# Patient Record
Sex: Female | Born: 1975 | Hispanic: No | Marital: Married
Health system: Western US, Academic
[De-identification: ages and names within clinical notes are randomized; demographics above are authoritative.]

---

## 2017-12-29 ENCOUNTER — Ambulatory Visit: Admit: 2017-12-29 | Discharge: 2017-12-29 | Disposition: A | Discharge status: Home / Self Care | Payer: Self-pay

## 2018-09-06 ENCOUNTER — Ambulatory Visit: Admit: 2018-09-06 | Discharge: 2018-09-06 | Disposition: A | Discharge status: Home / Self Care | Payer: Self-pay

## 2018-11-07 ENCOUNTER — Encounter: Payer: Self-pay | Admitting: Nurse Practitioner

## 2018-11-07 NOTE — Progress Notes (Signed)
Outside images archived.        Catherine Kearns, NP

## 2018-11-08 ENCOUNTER — Other Ambulatory Visit: Payer: Self-pay | Admitting: Nurse Practitioner

## 2020-07-17 IMAGING — MG MAMMOGRAPHY DIAGNOSTIC BILATERAL 3D TOMOSYNTHESIS WITH CAD
7 of 14 series · 7 of 40 positions shown · non-contrast
Comparison: Comparison was made to prior examinations. 
BREAST DENSITY: (Level B) There are scattered areas of fibroglandular density.

MAMMOGRAPHY DIAGNOSTIC BILATERAL 3D TOMOSYNTHESIS WITH CAD, BREAST ULTRASOUND 
BILATERAL COMPLETE, 07/17/2020 [DATE]: 
CLINICAL INDICATION: 2 areas of palpable concern involving the right breast. 
Strong family history of breast cancer. Bilateral breast biopsies. History of 
right breast excisional biopsy.
TECHNIQUE: Digital bilateral mammograms and 3-D Tomosynthesis were obtained. 
These were interpreted both primarily and with the aid of computer-aided 
detection system. Bilateral whole breast ultrasound was also performed.

[R CC (1 of 3)]
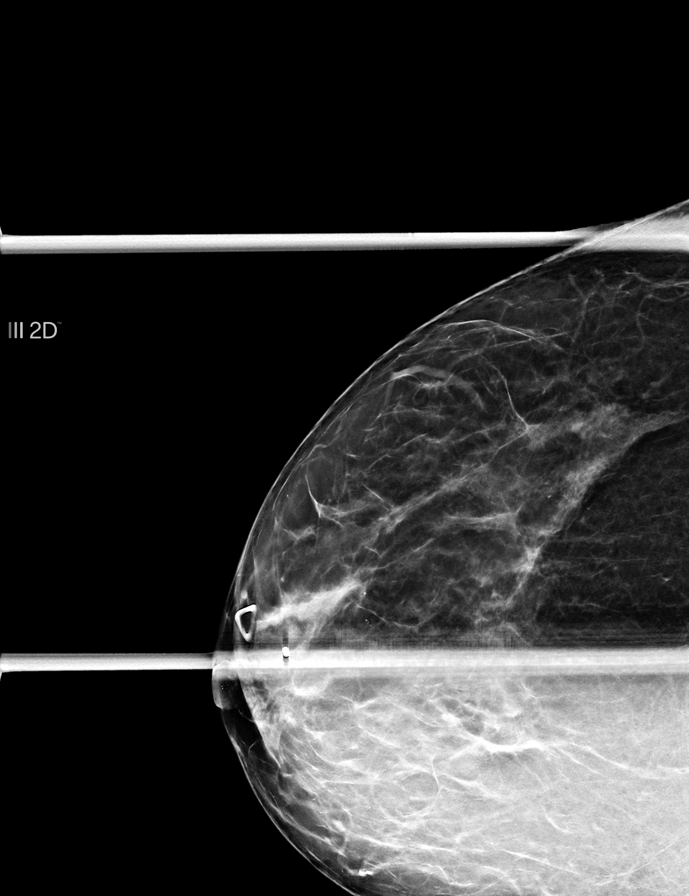

[R CC (2 of 3)]
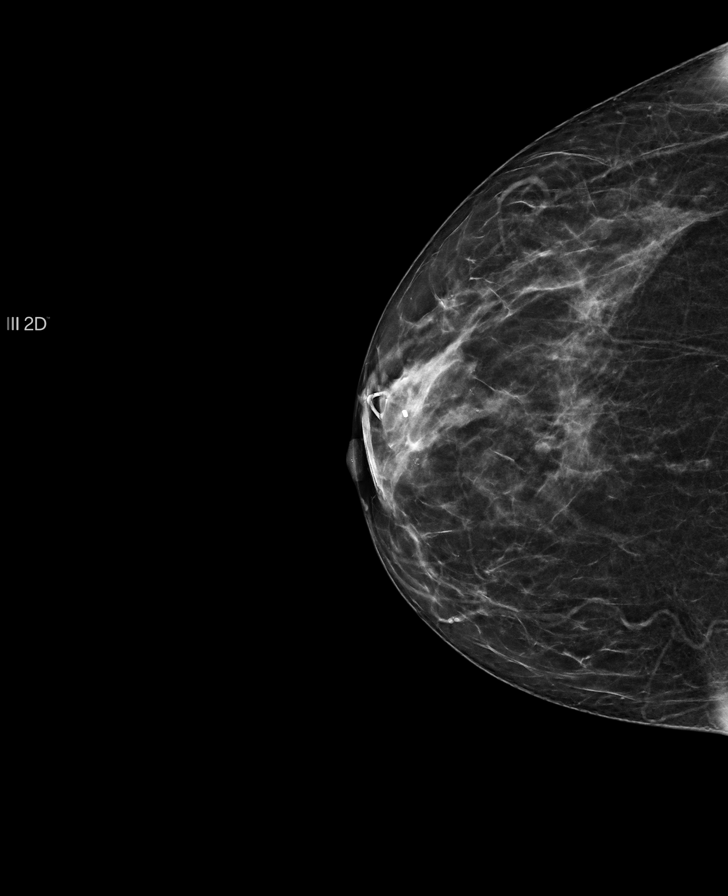

[R MLO]
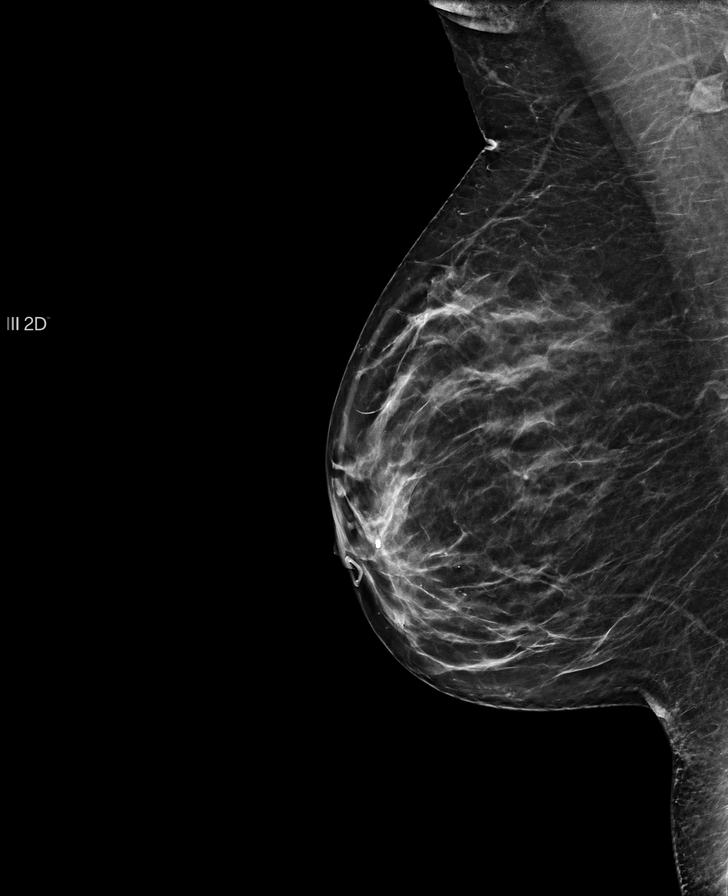

[L MLO]
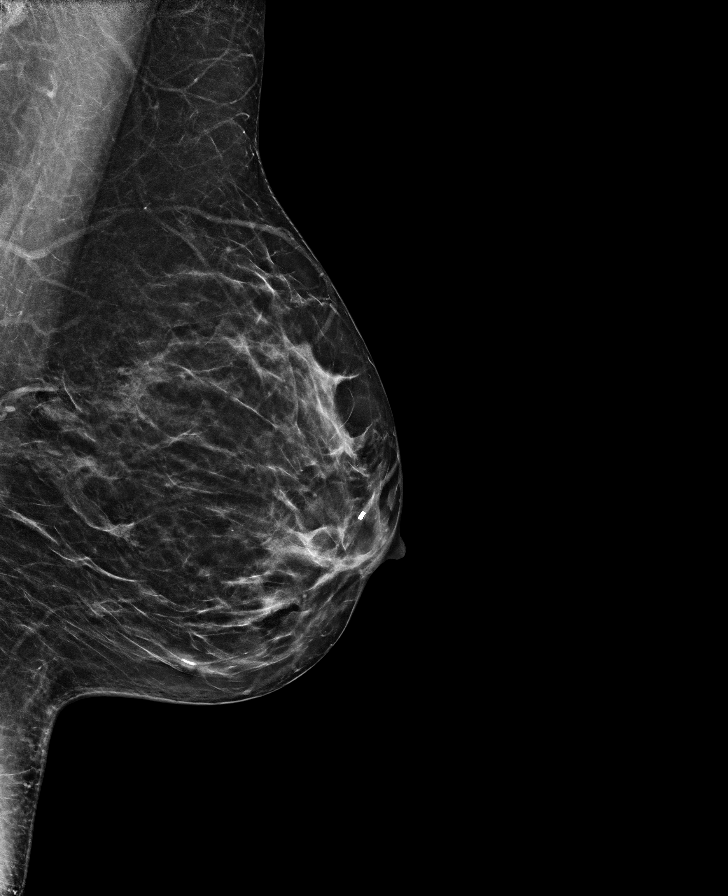

[R CC (3 of 3)]
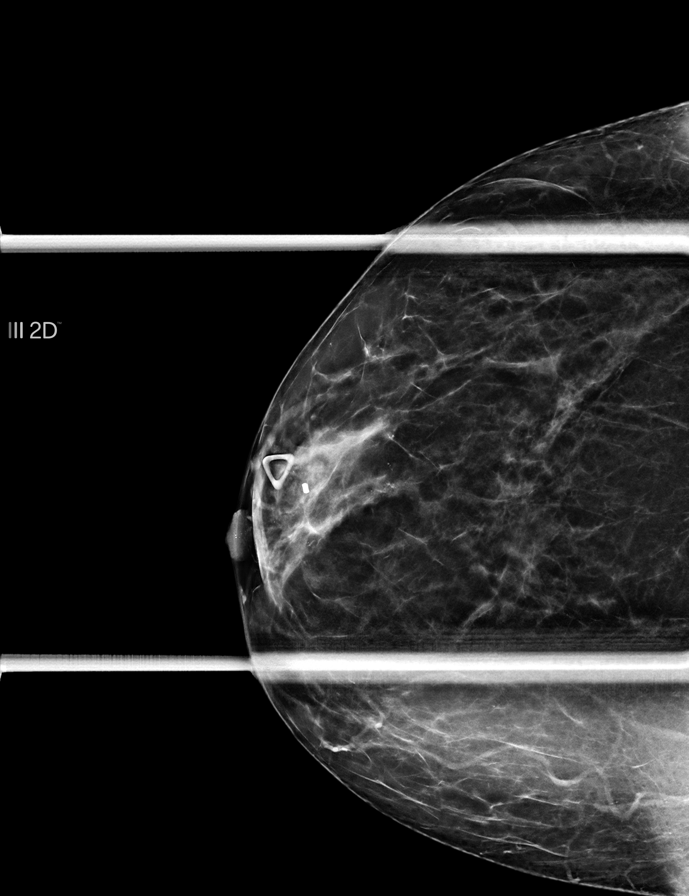

[R ML]
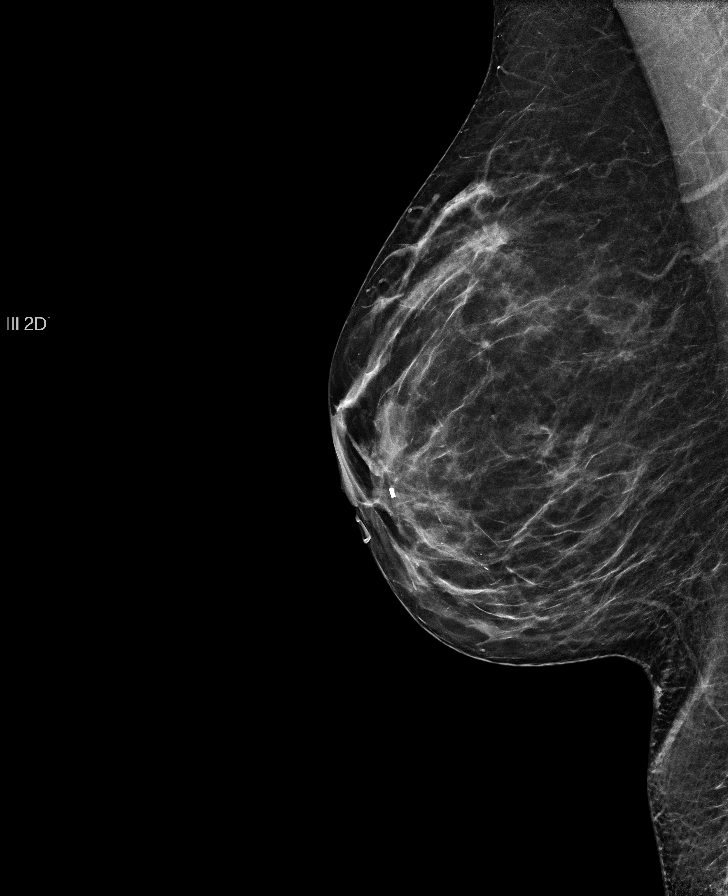

[L CC]
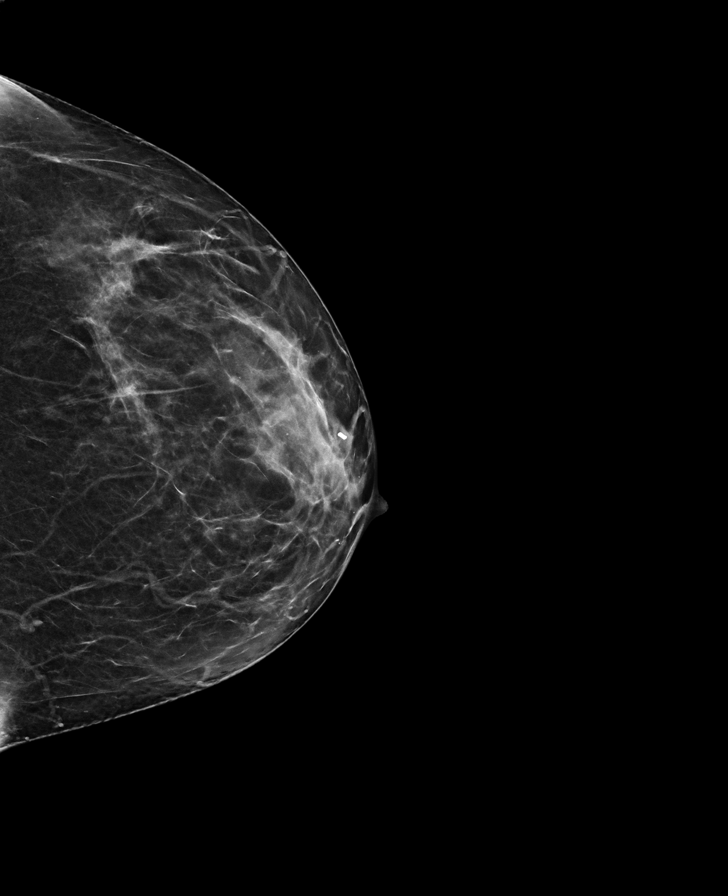

[7 of 40 positions shown; findings below may reference images not displayed]

FINDINGS: There is a right breast retroareolar post procedure clip. There is a 
palpable marker in the inferior retroareolar position of the right breast. There 
is no suspicious abnormality at this site. 
In the lateral right breast appreciated on the CC projection only located 9 cm 
from the nipple there is a circumscribed lesion. This area effaces on spot 
compression views there is a small circumscribed lesion projecting to the 
inferior lateral right breast measuring 4 mm. No definite correlate is 
identified on right MLO or mediolateral projection. 
In the upper outer quadrant of the left breast located 9 cm from the nipple 
there is a circumscribed lesion measuring 5 mm. This is favored to represent an 
intramammary lymph node but is ultimately indeterminate. No prior examinations 
are available for comparison. There is also a post procedure clip in the 
retroareolar position of the left breast. 
Bilateral whole breast ultrasound reveals a circumscribed hypoechoic lesion in 
the right breast at 8 o'clock 2 cm from the nipple measuring 4 x 3 x 3 mm 
corresponding to the site of palpable concern. This didn't straighten ultrasound 
appearance most consistent with an intramammary lymph node. Nonetheless 
follow-up is necessary. 
In the right axillary region at the second site of palpable concern there is a 
sonographically benign-appearing lymph node measuring up to 17 mm. No suspicious 
features. No other potential lesion is identified. Throughout the remainder the 
right breast to include all 4 quadrants the retroareolar position in the 
axillary region there is no suspicious mass, cyst, or adenopathy. 
In the left breast to include all 4 quadrants the retroareolar position and the 
axillary region there is no suspicious mass or cystic lesion. No worrisome 
adenopathy.
IMPRESSION: 1.  No correlate for the circumscribed mammographic lesions in the lateral 
breast bilaterally. All these are favored to represent intramammary lymph nodes 
no prior mammography is available to establish stability. Breast MRI is 
recommended for further evaluation. 
2.  Hypoechoic lesion in the right breast at 8 o'clock 2 cm from the nipple 
corresponding to the periareolar area of palpable concern. Favored to represent 
an intramammary lymph node. This is too small to identify with accuracy on MRI. 
Follow-up in 6 months time via ultrasound is recommended. 
3.  Sonographically benign-appearing lymph node in the right axillary region at 
the second site of palpable concern in the right breast. No suspicious findings. 
Clinical management of the areas of palpable concern is ultimately necessary. 
4.  No sonographic evidence of malignancy throughout the left breast. 
(BI-RADS 0) Further evaluation with MRI is suggested.

## 2021-08-04 IMAGING — MR MRI CERVICAL SPINE W/WO CONTRAST
9 of 10 series · 37 of 48 positions shown · IV contrast (gadavist)
Comparison: Brain MRI August 04, 2021.

________________________________________________________________________________________________ 
MRI CERVICAL SPINE W/WO CONTRAST, 08/04/2021 [DATE]: 
CLINICAL INDICATION: 45-year-old female with seizure disorder and demyelinating 
disorder. Neuromyelitis [place]. Diminished sensation in the arm and leg. History 
of breast cancer.
TECHNIQUE: Multiplanar, multiecho position MR images of the cervical spine were 
performed without and with 10 mL of Gadavist were injected. Patient was scanned 
on a 3T magnet.

[Series 901: survey · axial · 10.0mm · 0.94mm/px · 1 of 9 slices shown]
[im 1/9]
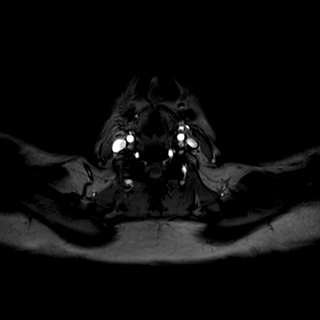

[Series 1001: t2w_tse cor · coronal · 5.0mm · 0.47mm/px · 1 of 7 slices shown]
[im 1/7]
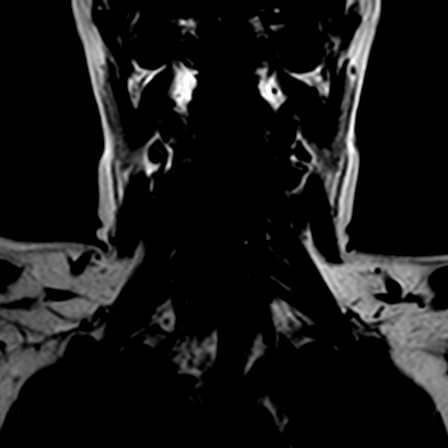

[Series 1101: t1w_tse sag · sagittal · 3.0mm · 0.42mm/px · 4 of 15 slices shown]
[im 1/15]
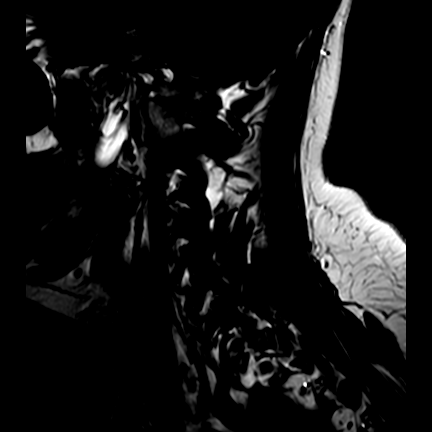
[im 5/15]
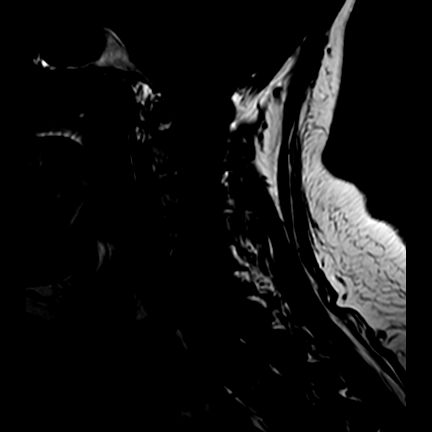
[im 10/15]
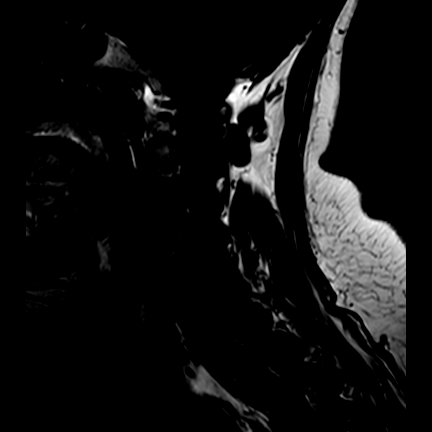
[im 15/15]
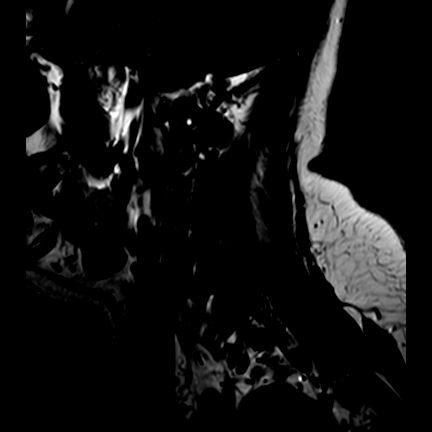

[Series 1201: t2w_tse sag · sagittal · 3.0mm · 0.52mm/px · 4 of 15 slices shown]
[im 1/15]
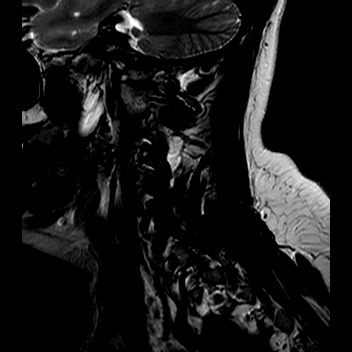
[im 5/15]
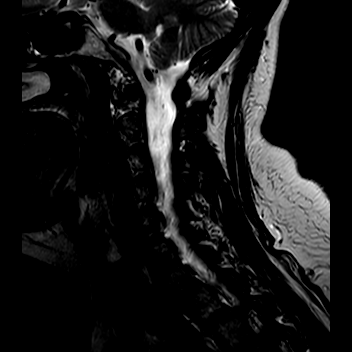
[im 10/15]
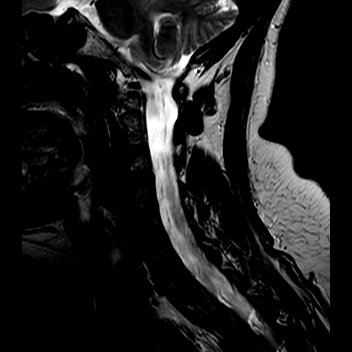
[im 15/15]
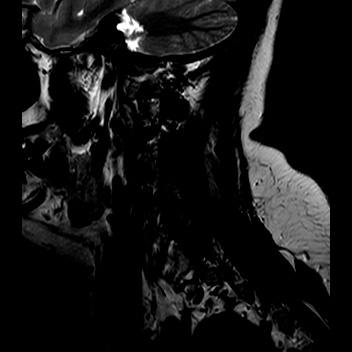

[Series 1301: stir_longte sag · sagittal · 3.0mm · 0.62mm/px · 4 of 15 slices shown]
[im 1/15]
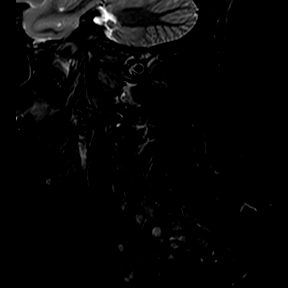
[im 5/15]
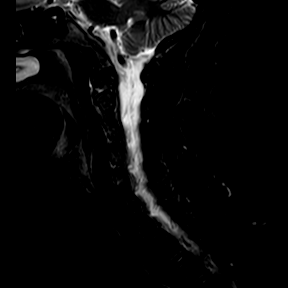
[im 10/15]
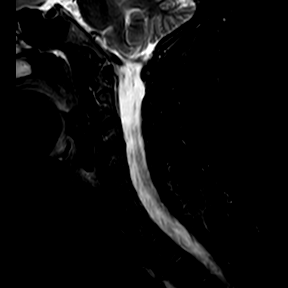
[im 15/15]
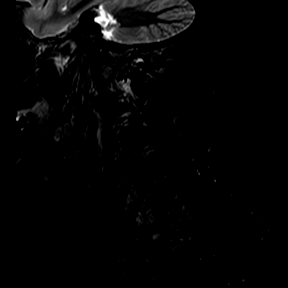

[Series 1401: t2w_tse_ax · axial · 3.0mm · 0.36mm/px · z∈[-185,-62]mm · 8 of 42 slices shown]
[im 1/42]
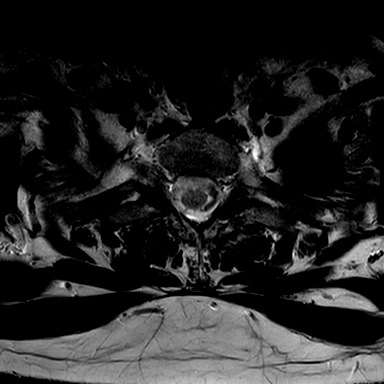
[im 5/42]
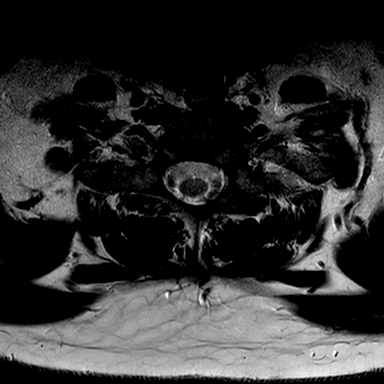
[im 14/42]
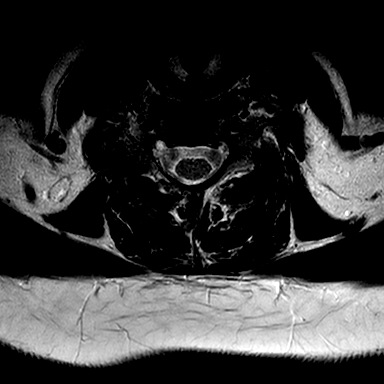
[im 19/42]
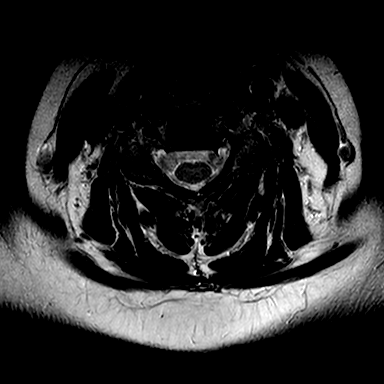
[im 23/42]
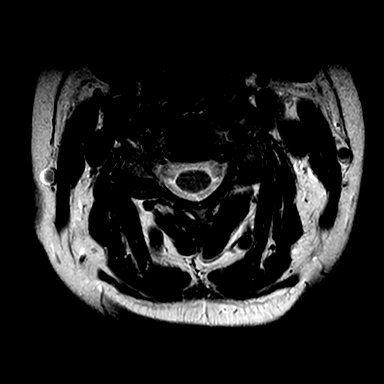
[im 28/42]
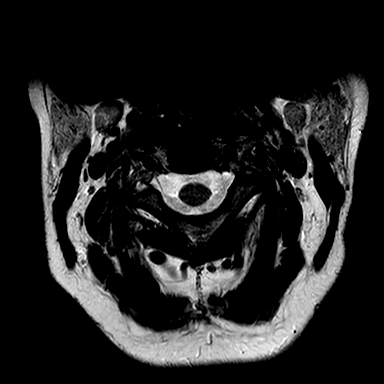
[im 37/42]
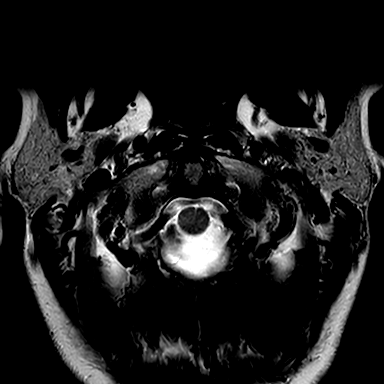
[im 42/42]
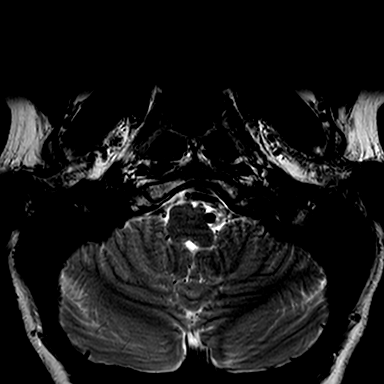

[Series 1501: t2w_ffe_3d hi (dr.(person_name) · axial · 3.0mm · 0.47mm/px · z∈[-152,-105]mm · 8 of 36 slices shown]
[im 1/36]
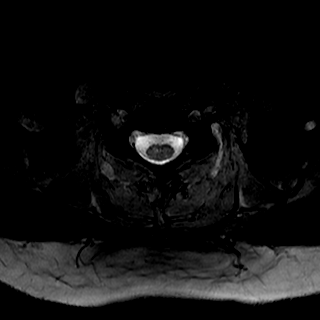
[im 6/36]
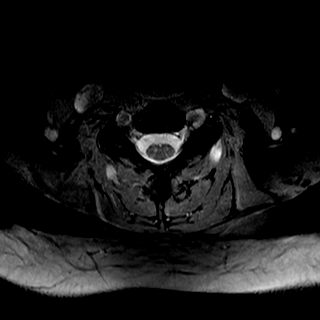
[im 11/36]
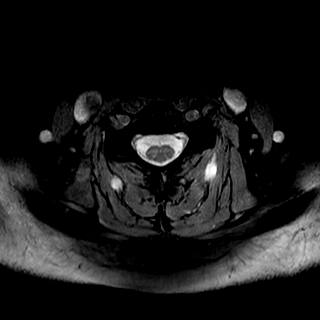
[im 16/36]
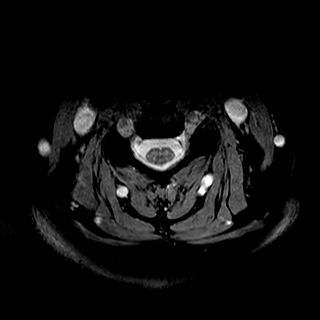
[im 21/36]
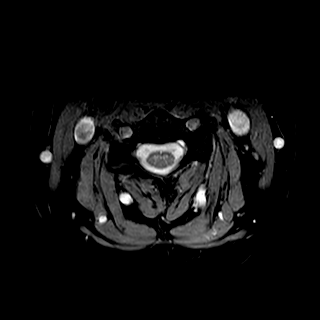
[im 26/36]
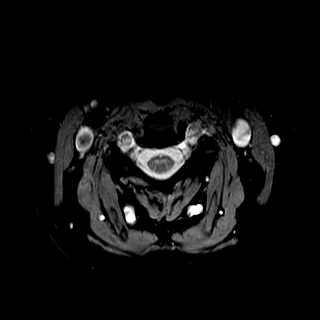
[im 31/36]
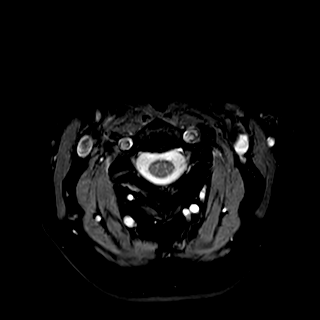
[im 36/36]
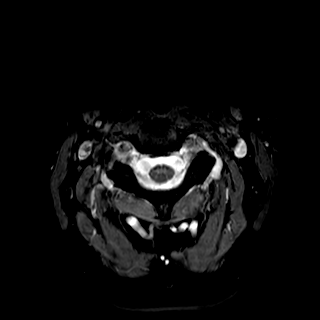

[Series 1701: gad t1w_tse sag · sagittal · 3.0mm · 0.51mm/px · 3 of 15 slices shown]
[im 1/15]
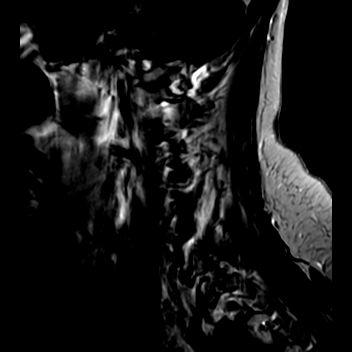
[im 5/15]
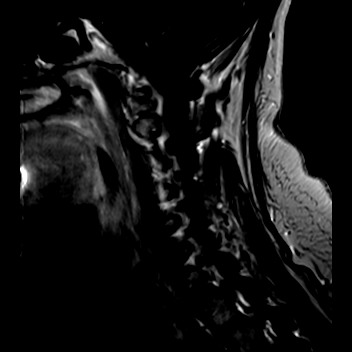
[im 10/15]
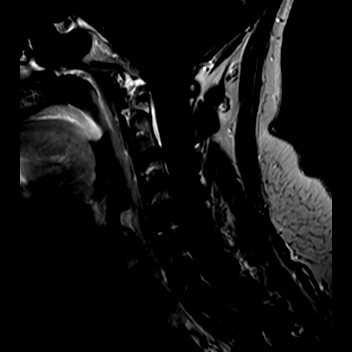

[Series 1801: T1 fat-sat post-contrast · sagittal · 3.0mm · 0.51mm/px · 4 of 15 slices shown]
[im 1/15]
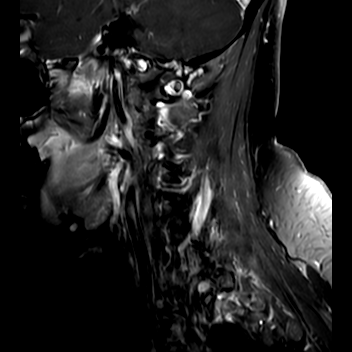
[im 5/15]
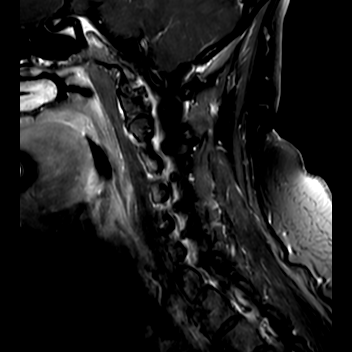
[im 10/15]
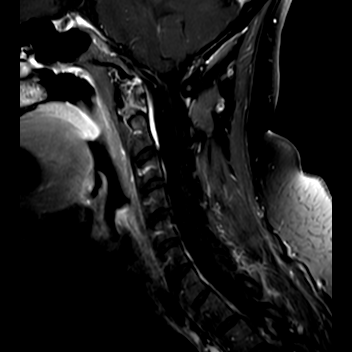
[im 15/15]
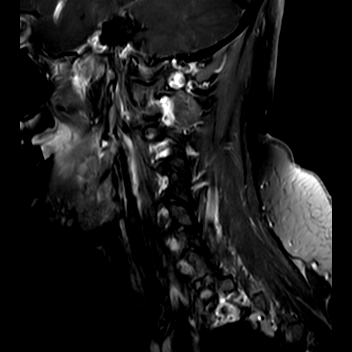

[37 of 48 positions shown; findings below may reference images not displayed]

FINDINGS: Anatomic sagittal alignment. Vertebral body height preserved. Normal 
marrow signal. No marrow or ligamentous edema. Visualized brainstem and 
posterior fossa are unremarkable. No abnormal enhancement. Bilateral nonenlarged 
level 2A lymph nodes noted, measuring 1.3 cm on the right and measuring 1.1 cm 
on the left. No evidence of cord atrophy, expansion, or abnormal cord signal. No 
abnormal cord enhancement..  
Craniocervical junction: Normal 
C2-C3: Slight loss of disc signal. Canal and foramina with normal facets. Normal 
disc height. 
C3-C4: The disc is normal in height and signal. No disc herniation. Normal 
facets. No spinal canal or neural foraminal stenosis. 
C4-C5: The disc is normal in height and signal. No disc herniation. Normal 
facets. No spinal canal or neural foraminal stenosis. 
C5-C6: The disc is normal in height and signal. No disc herniation. Normal 
facets. No spinal canal or neural foraminal stenosis. 
C6-C7: The disc is normal in height and signal. No disc herniation. Normal 
facets. No spinal canal or neural foraminal stenosis. 
C7-T1: The disc is normal in height and signal. No disc herniation. Normal 
facets. No spinal canal or neural foraminal stenosis. 
Visualized upper thoracic segments are unremarkable.
IMPRESSION: Unremarkable cervical spine MRI. No abnormal enhancement. No abnormal cord 
signal. 
Bilateral nonenlarged level 2A lymph nodes noted.

## 2021-08-04 IMAGING — MR MRI BRAIN W/WO CONTRAST
6 of 14 series · 18 of 48 positions shown · IV contrast (gadavist)
Comparison: None

________________________________________________________________________________________________ 
******** ADDENDUM #1 ********/n 
***CORRECTED REPORT, TECHNIQUE EDITED***
TECHNIQUE: Multiplanar, multiecho position MR images of the brain were performed 
without and with 10 mL of Gadavist contrast. Patient was scanned on a 3T magnet. 
MRI BRAIN W/WO CONTRAST, 08/04/2021 [DATE]: 
CLINICAL INDICATION: 45-year-old female with seizure disorder. Demyelinating 
disorder. Neuromyelitis [place]. History of breast cancer.
without and with mL of Gadavist contrast. Patient was scanned on a 3T magnet.

[Series 401: FLAIR · axial · 5.0mm · 0.60mm/px · z∈[-89,+58]mm · 2 of 27 slices shown (1 of 2)]
[im 1/27]
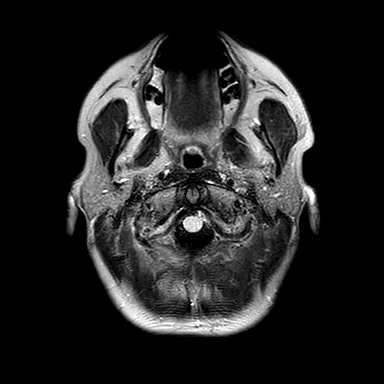
[im 27/27]
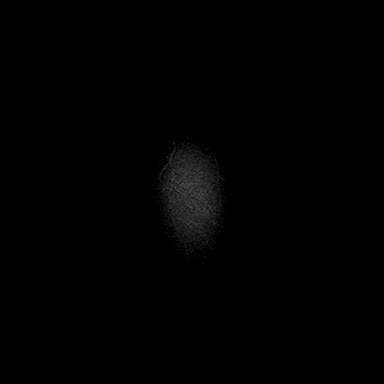

[Series 601: SWI · axial · 3.0mm · 0.53mm/px · z∈[-88,-71]mm · 2 of 100 slices shown]
[im 1/100]
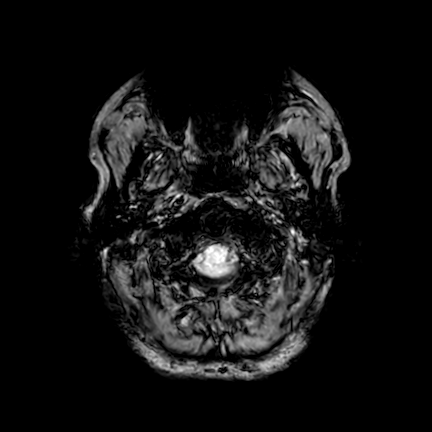
[im 13/100]
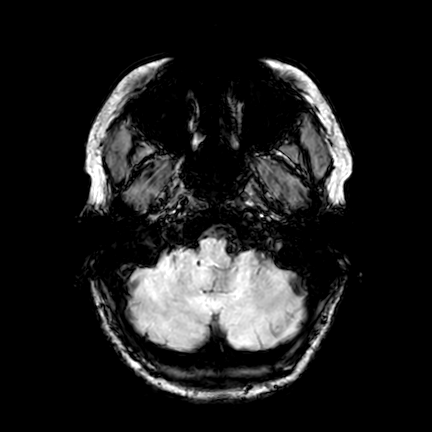

[Series 701: T2 · axial · 5.0mm · 0.41mm/px · z∈[-90,+58]mm · 3 of 27 slices shown]
[im 1/27]
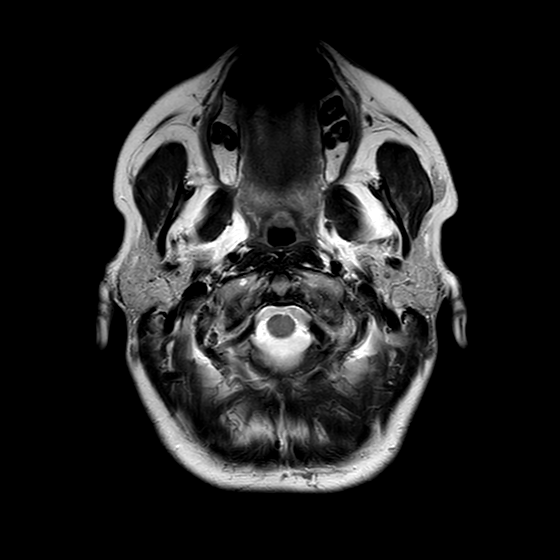
[im 14/27]
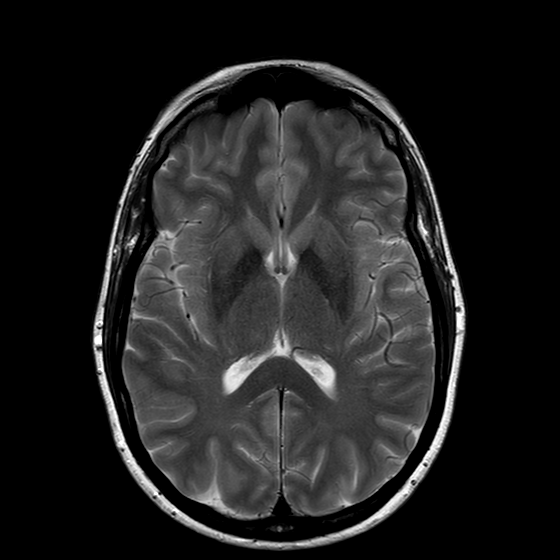
[im 27/27]
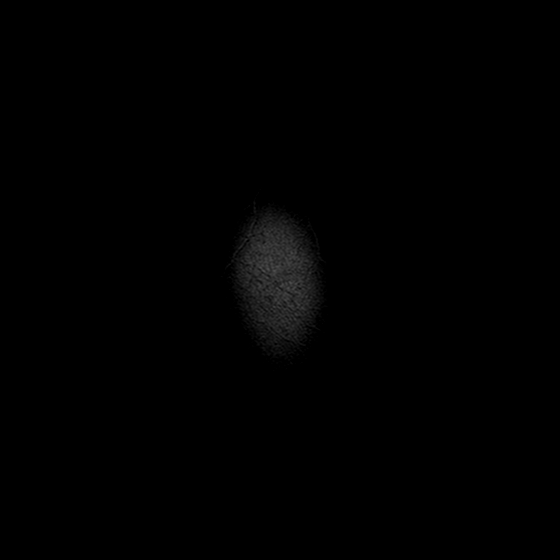

[Series 801: FLAIR · sagittal · 3.0mm · 0.45mm/px · 4 of 40 slices shown (2 of 2)]
[im 1/40]
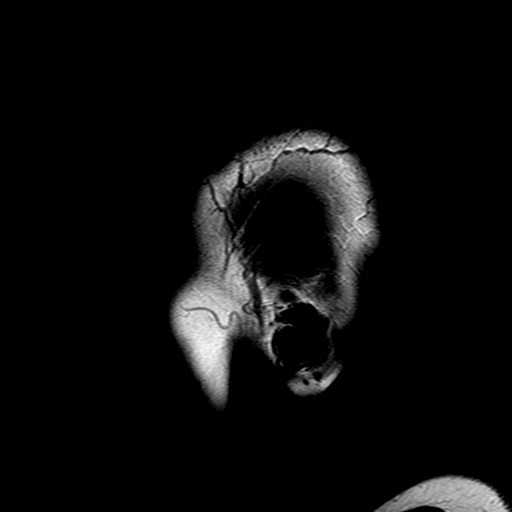
[im 14/40]
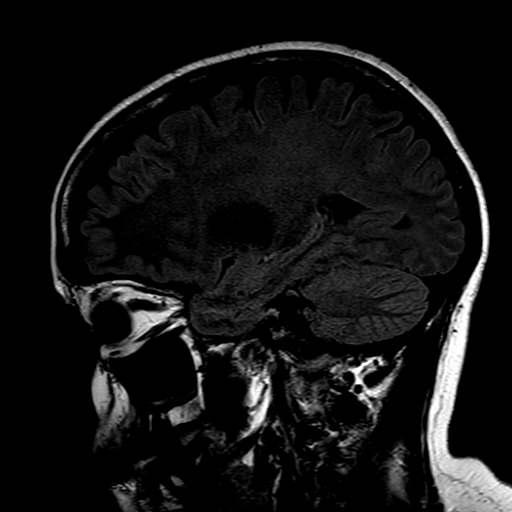
[im 27/40]
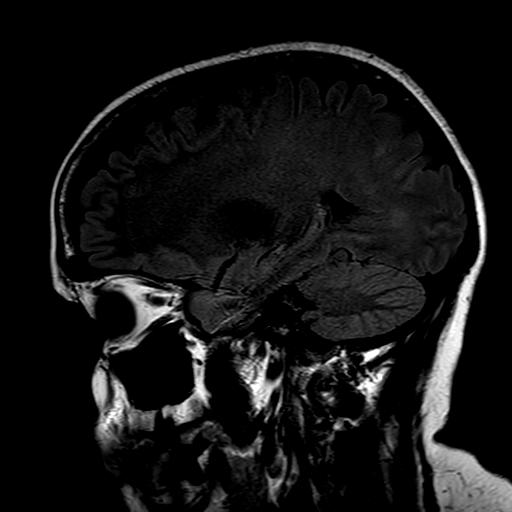
[im 40/40]
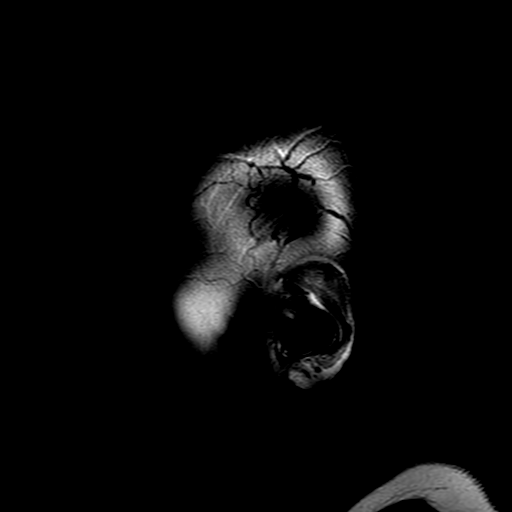

[Series 2101: T1 post-contrast · coronal · 4.0mm · 0.38mm/px · 4 of 38 slices shown (1 of 2)]
[im 1/38]
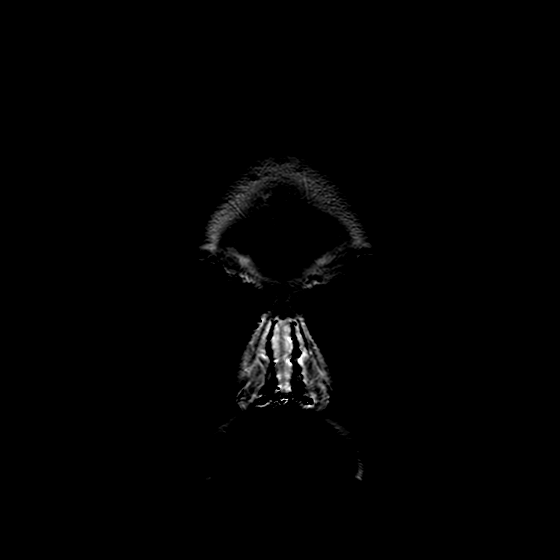
[im 13/38]
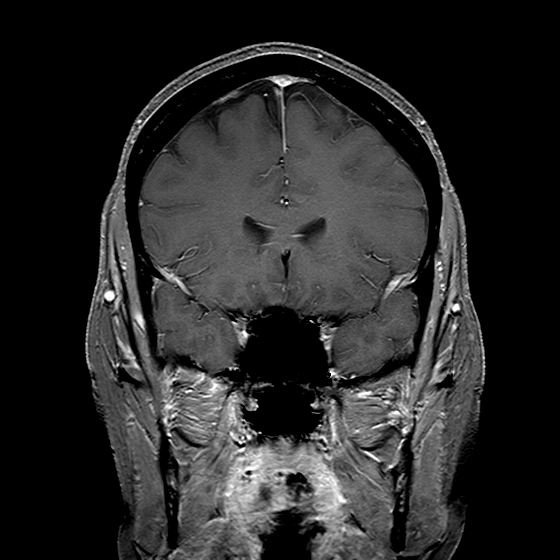
[im 25/38]
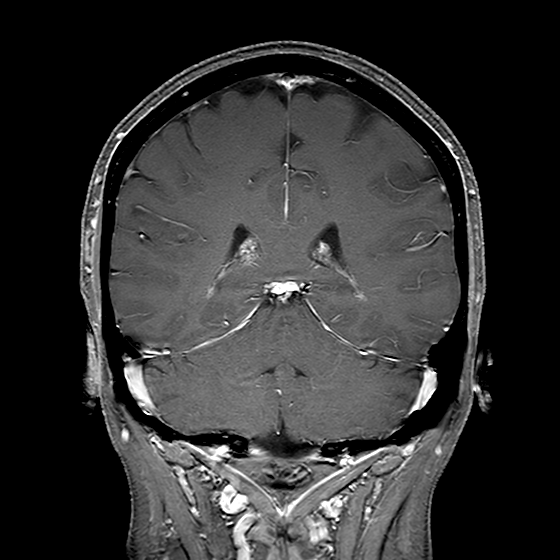
[im 38/38]
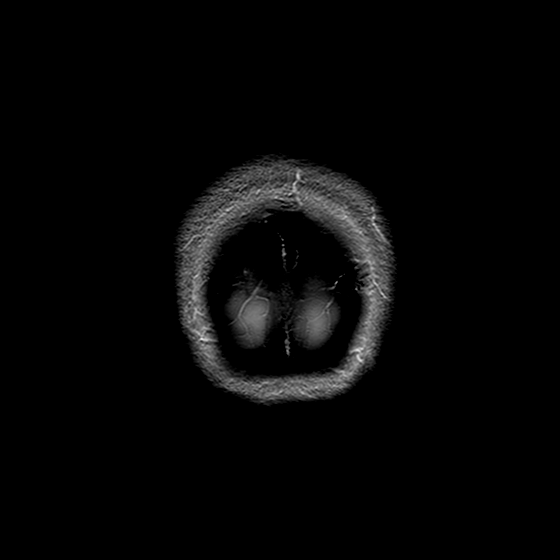

[Series 2201: T1 post-contrast · sagittal · 4.0mm · 0.34mm/px · 3 of 36 slices shown (2 of 2)]
[im 1/36]
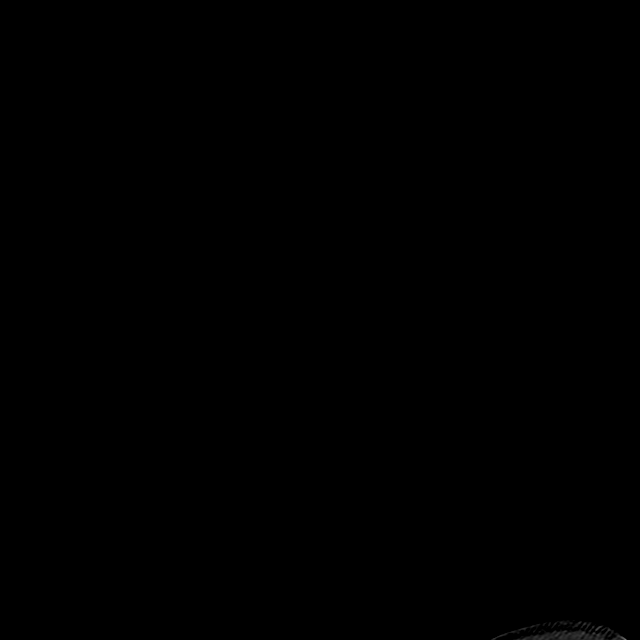
[im 18/36]
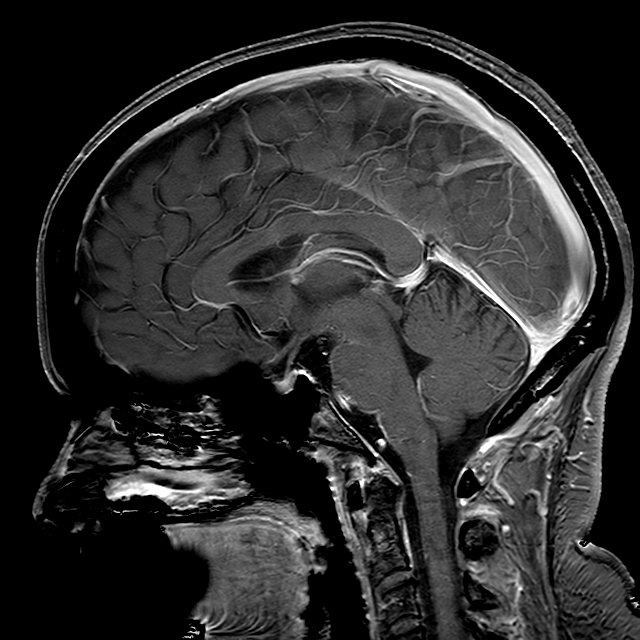
[im 36/36]
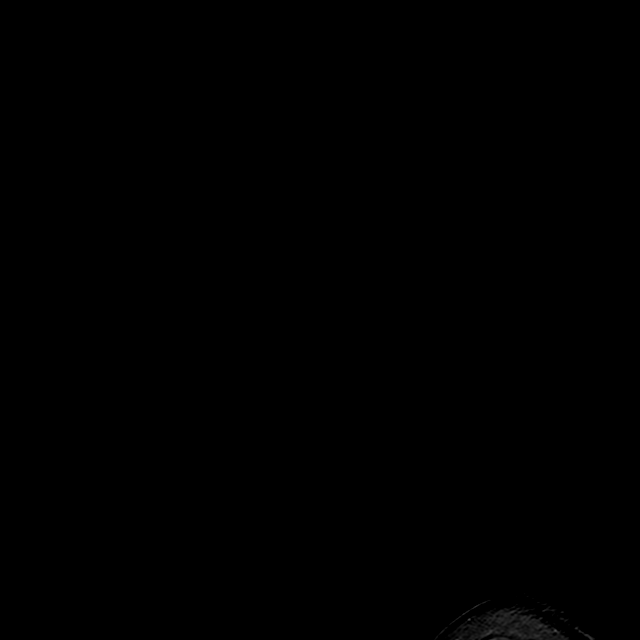

[18 of 48 positions shown; findings below may reference images not displayed]

FINDINGS: Intracranial contents: Punctate focus of increased T2 signal in the left 
paramedian midbrain image 11 series 505 is related to T2 shine through. No 
evidence of acute or subacute infarct. Normally positioned cerebellar tonsils. 
No intracranial hemorrhage of any type. Corpus callosum demonstrates preserved 
parenchymal volume. No periventricular or pericallosal signal abnormalities. 
Sella unremarkable. Brain parenchymal signal pattern is unremarkable. There are 
preserved skull base vascular flow voids. Upper cervical cord unremarkable. No 
mass, mass effect, abnormal extra-axial fluid collection. Ventricles and sulci 
are appropriate for age. No abnormal enhancement. No evidence of temporal lobe 
atrophy. Hippocampal formations are preserved. 
Orbits sinuses and temporal bones: No acute orbital abnormality. Left maxillary 
mucous retention cyst. Other sinuses are well pneumatized. No mastoid or middle 
ear effusion. 
Osseous structures and soft tissues: Normal marrow signal.
IMPRESSION: Unremarkable brain MRI. Brain parenchymal signal pattern is unremarkable. No 
abnormal enhancement. No evidence of intracranial metastatic disease.

## 2021-08-25 IMAGING — MG MAMMOGRAPHY DIAGNOSTIC BILATERAL 3[PERSON_NAME]
8 series · 8 of 24 positions shown · non-contrast
Comparison: July 17, 2020

________________________________________________________________________________________________ 
MAMMOGRAPHY DIAGNOSTIC BILATERAL 3YHOMAR GUTIERREX, 08/25/2021 [DATE]: 
CLINICAL INDICATION: Family history of breast malignancy. Prior bilateral 
negative breast biopsies. More recently patient complains of the inflamed and 
pain in the RIGHT breast.
TECHNIQUE: Digital bilateral mammograms and 3-D Tomosynthesis were obtained. 
These were interpreted both primarily and with the aid of computer-aided 
detection system.  
BREAST DENSITY: (Level B) There are scattered areas of fibroglandular density.

[R MLO]
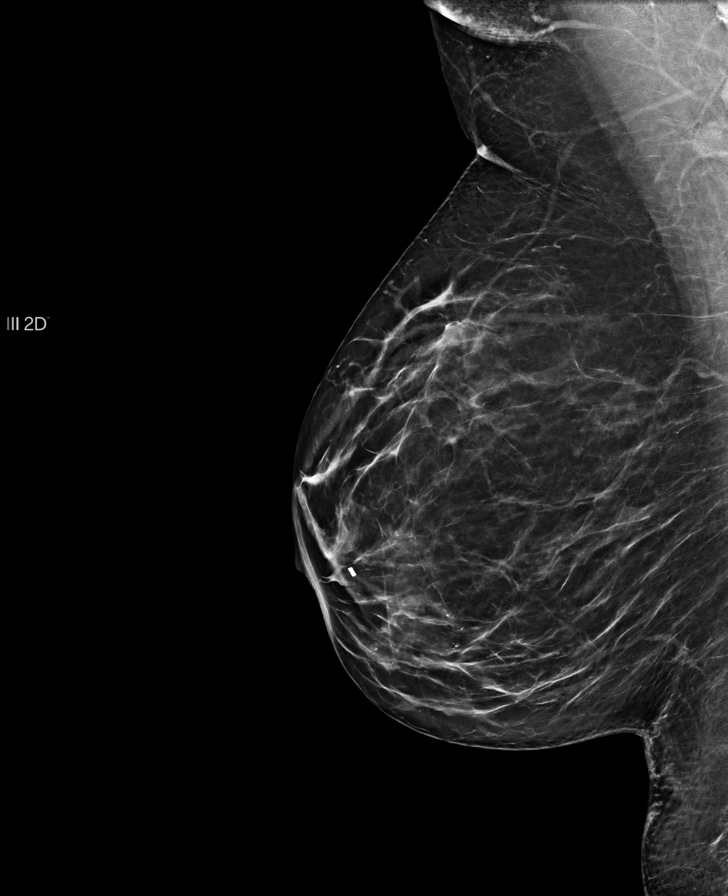

[L MLO]
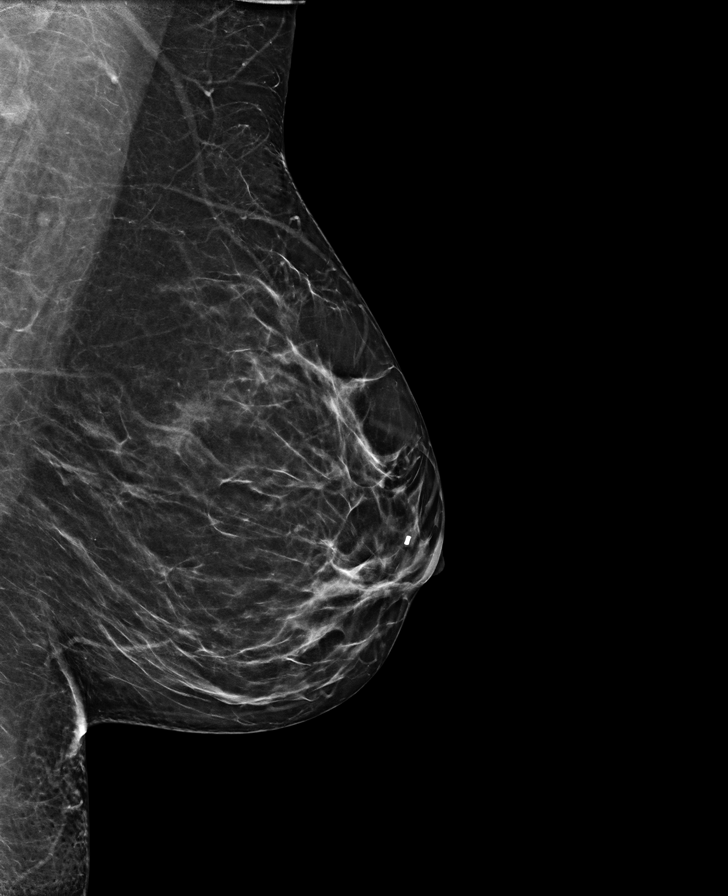

[L CC]
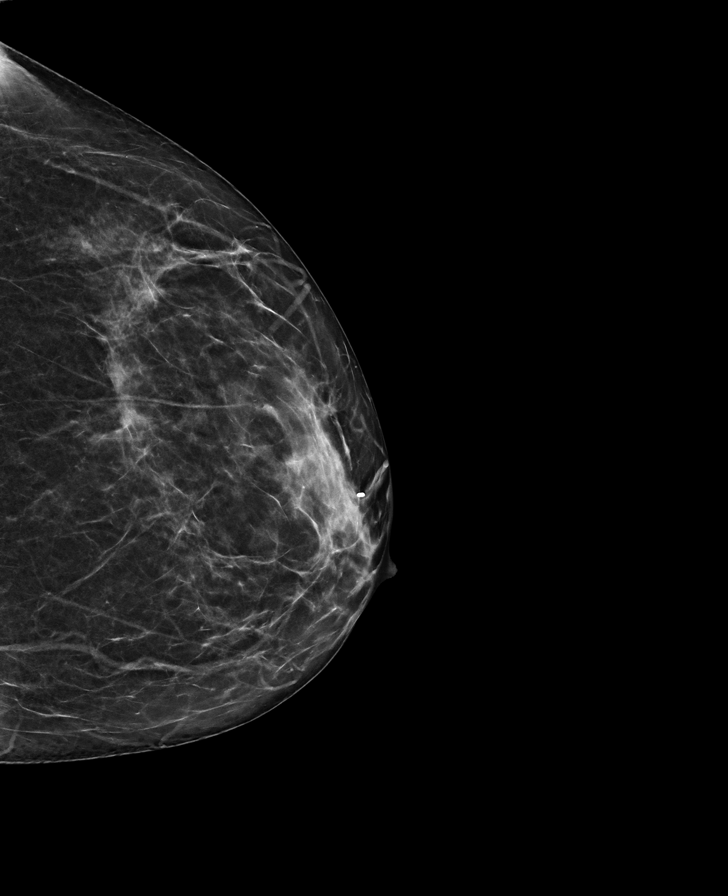

[R CC]
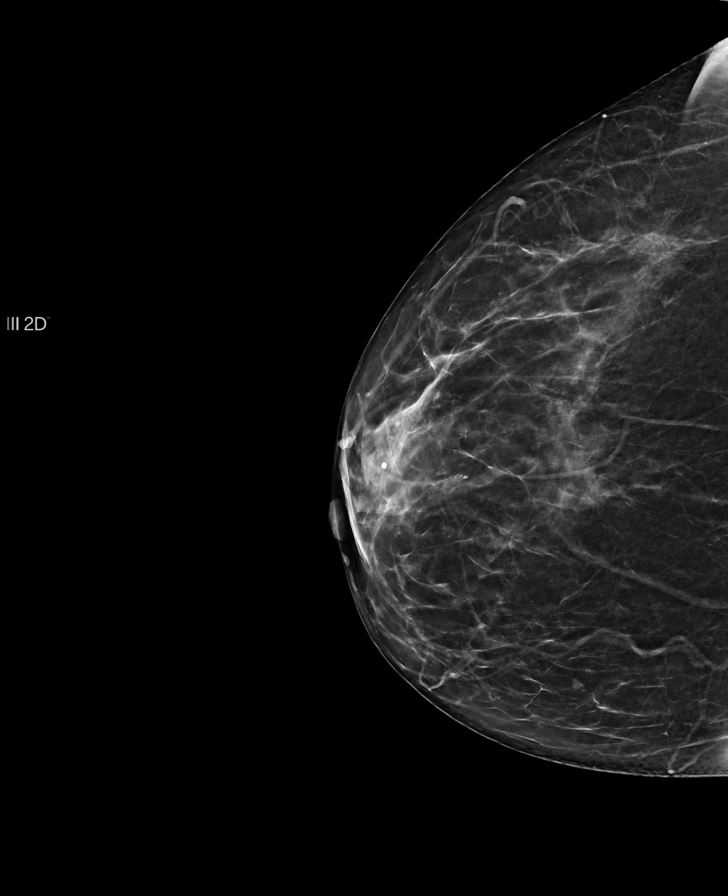

[R CC tomo · tomo slice 29/57.0]
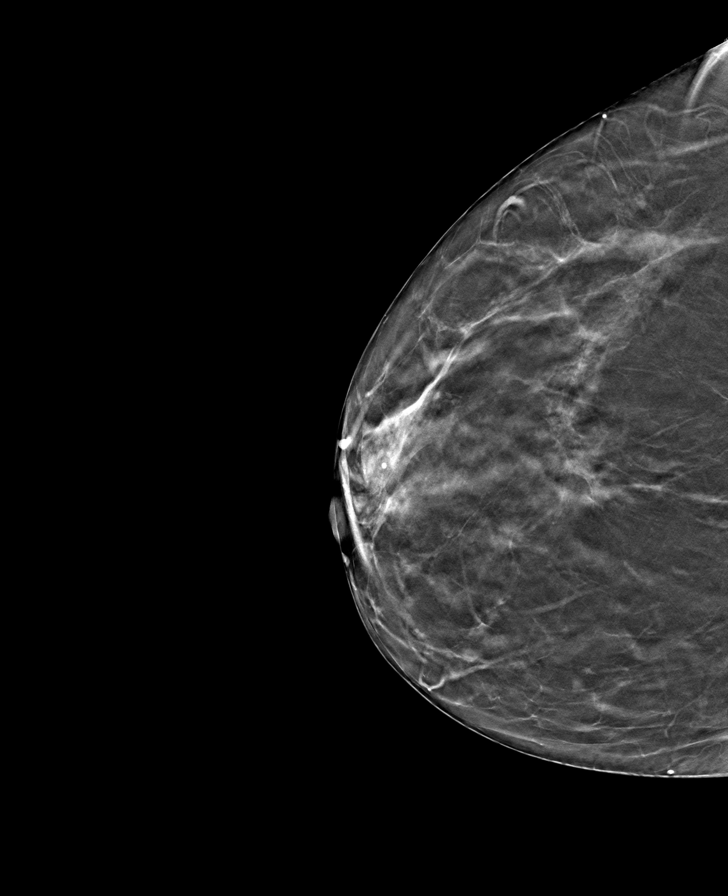

[L MLO tomo · tomo slice 33/66.0]
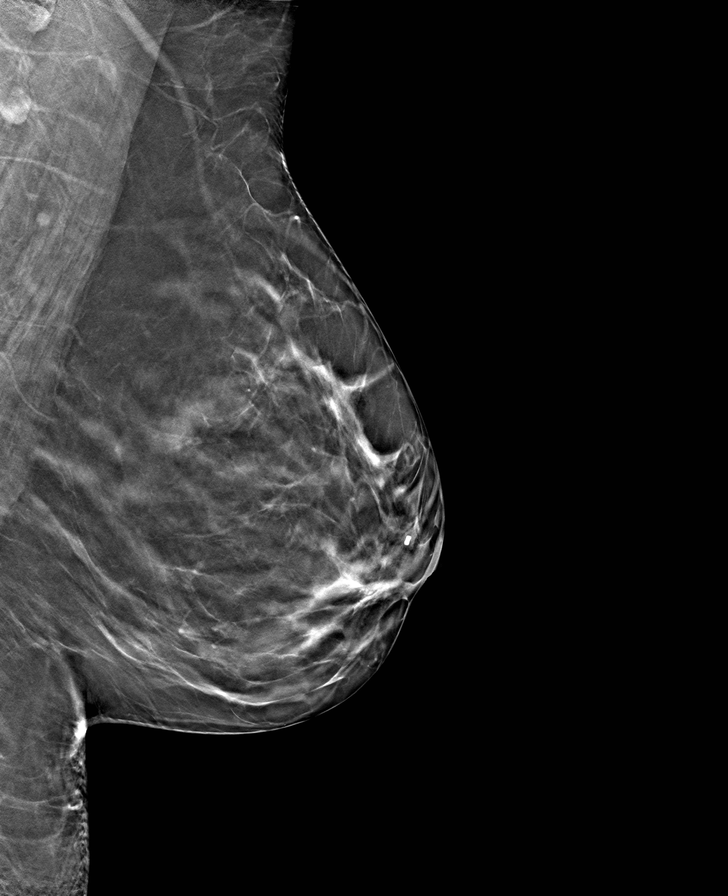

[L CC tomo · tomo slice 28/55.0]
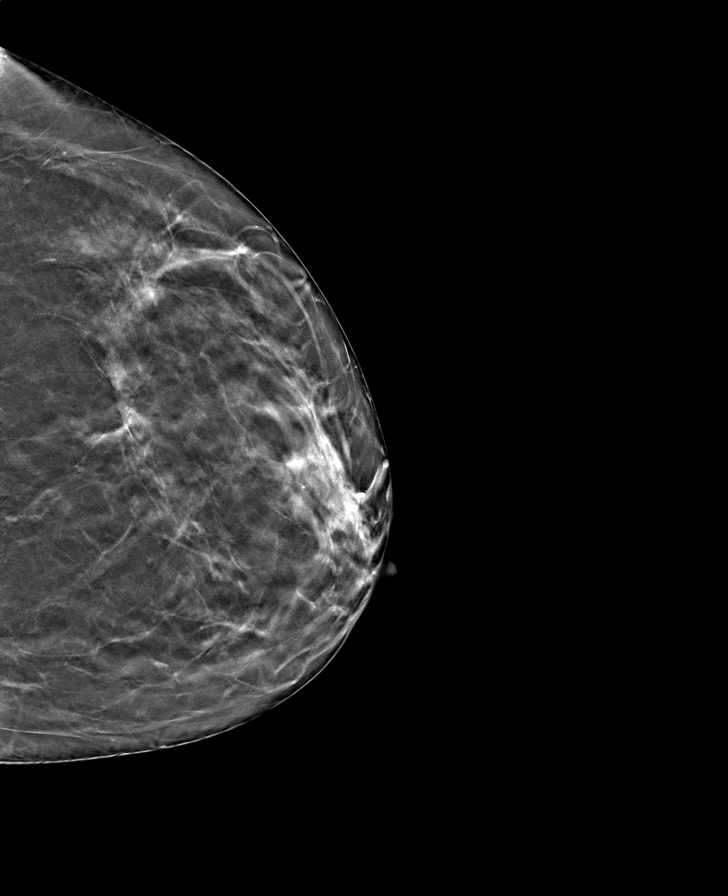

[R MLO tomo · tomo slice 33/66.0]
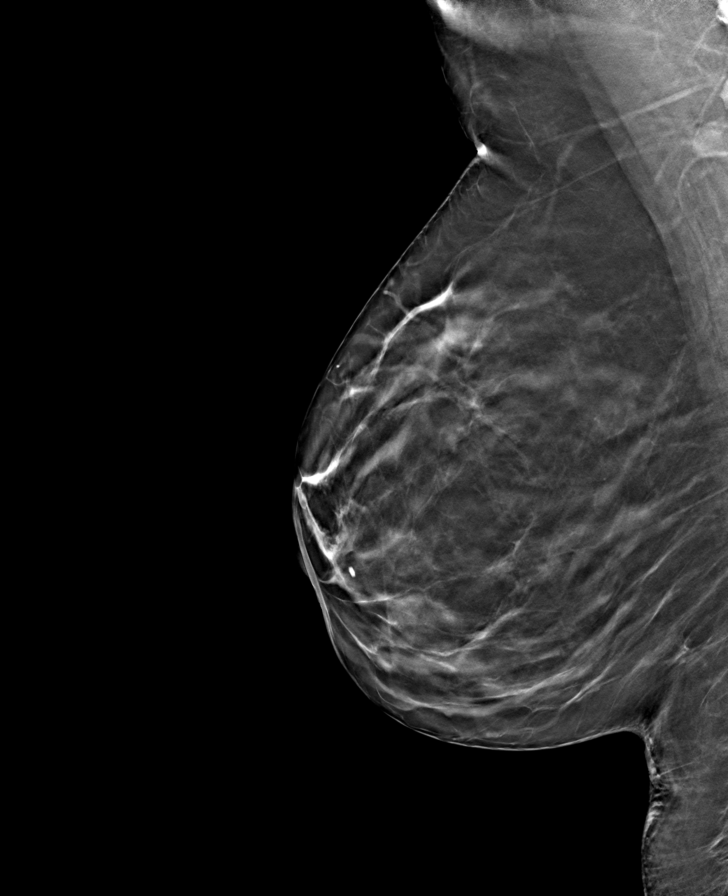

[8 of 24 positions shown; findings below may reference images not displayed]

FINDINGS: There are no mammographically suspicious findings and no significant 
change. Biopsy markers are in both breasts. Nonsuspicious calcifications are in 
the RIGHT breast.
IMPRESSION: (BI-RADS 1) Negative mammogram. Routine mammographic follow-up is recommended.

## 2021-09-09 IMAGING — MR MRI LUMBAR SPINE W/WO CONTRAST
4 of 9 series · 13 of 48 positions shown · IV contrast (gadolinium)
Comparison: None.

________________________________________________________________________________________________ 
MRI THORACIC SPINE W/WO CONTRAST, MRI LUMBAR SPINE W/WO CONTRAST, 09/09/2021 
[DATE]: 
CLINICAL INDICATION: Demyelinating disorder. Transient left hemibody weakness 
and sensory loss. Neural sensory loss. Fecal incontinence.
TECHNIQUE: Multiplanar, multiecho position MR images of the thoracic spine were 
performed without and with intravenous gadolinium enhancement. 10 mL of Gadavist 
were injected intravenously by hand. Patient was scanned on a 3T magnet.

[Series 101: survey · axial · 10.0mm · 1.39mm/px · z∈[-15,+199]mm · 2 of 9 slices shown]
[im 1/9]
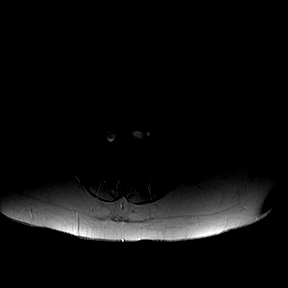
[im 9/9]
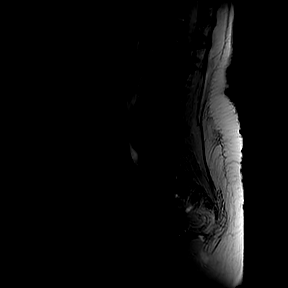

[Series 201: t2w_cor-surv · coronal · 6.0mm · 0.50mm/px · 1 of 5 slices shown]
[im 1/5]
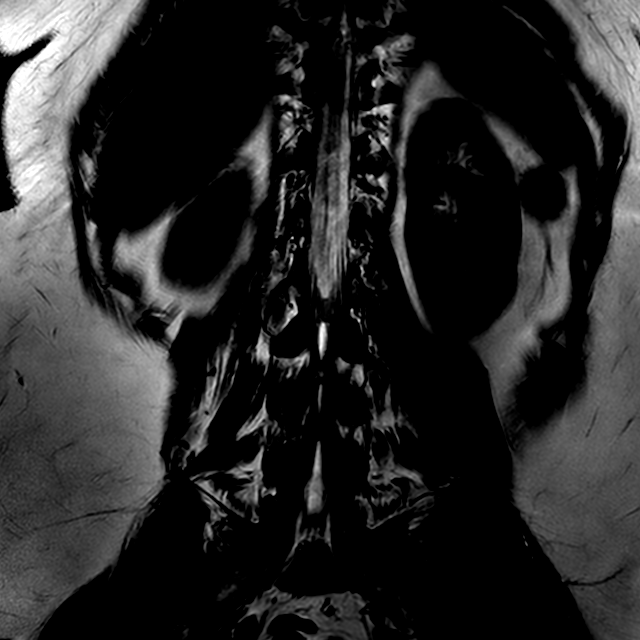

[Series 301: t2w_tse sag · sagittal · 4.0mm · 0.35mm/px · 5 of 17 slices shown]
[im 1/17]
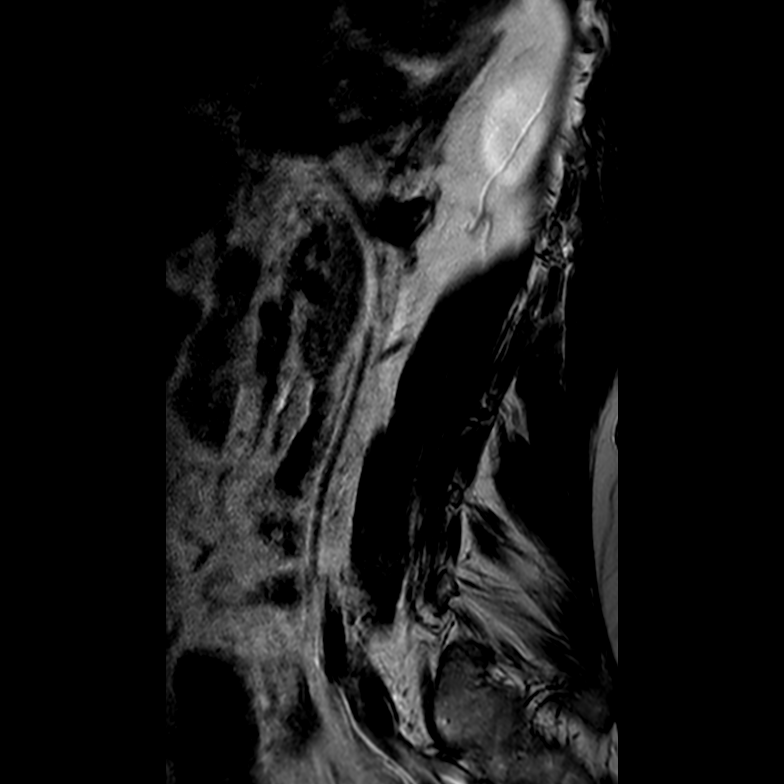
[im 5/17]
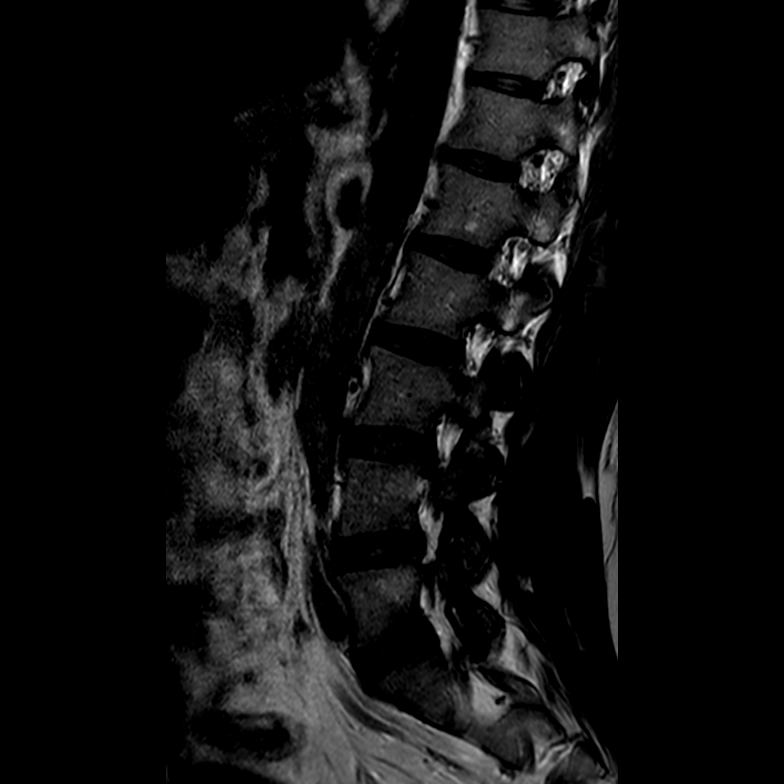
[im 9/17]
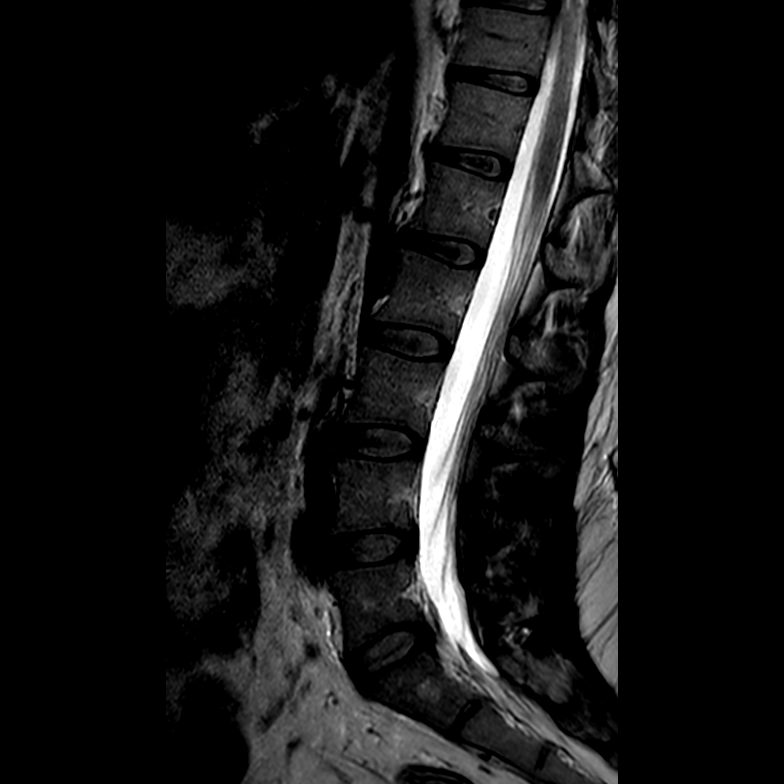
[im 13/17]
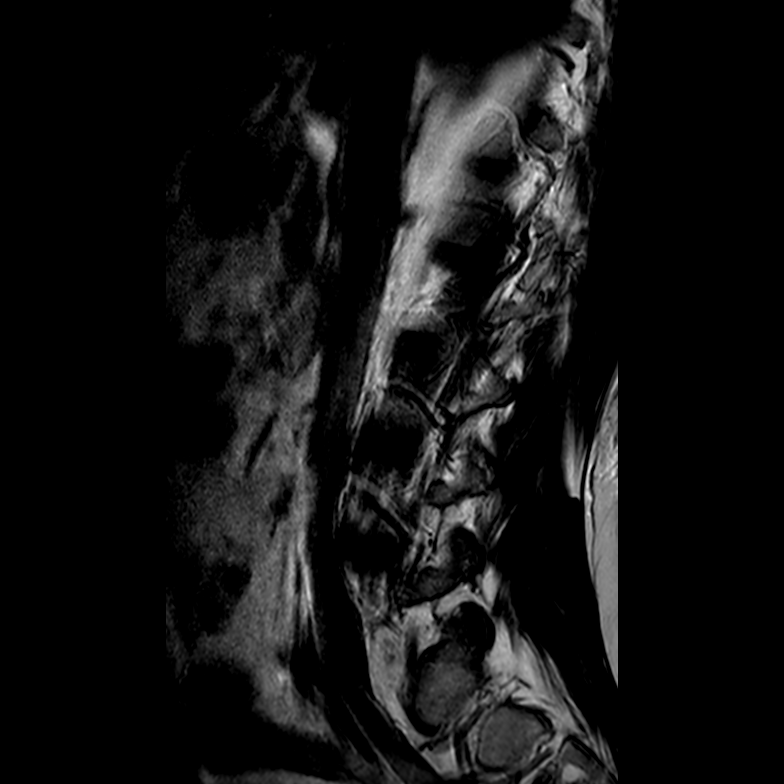
[im 17/17]
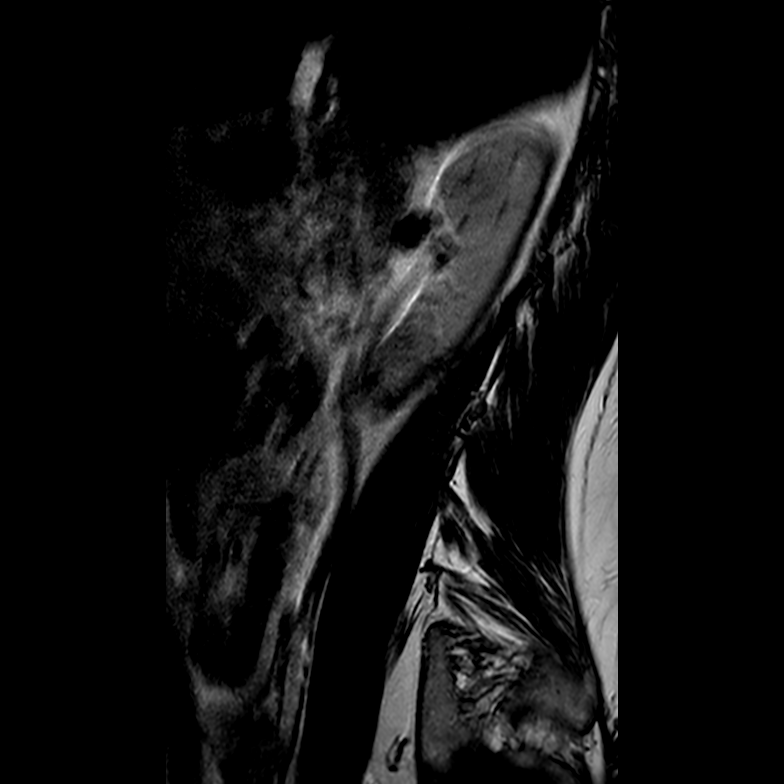

[Series 401: t1w_tse sag · sagittal · 4.0mm · 0.54mm/px · 5 of 17 slices shown]
[im 1/17]
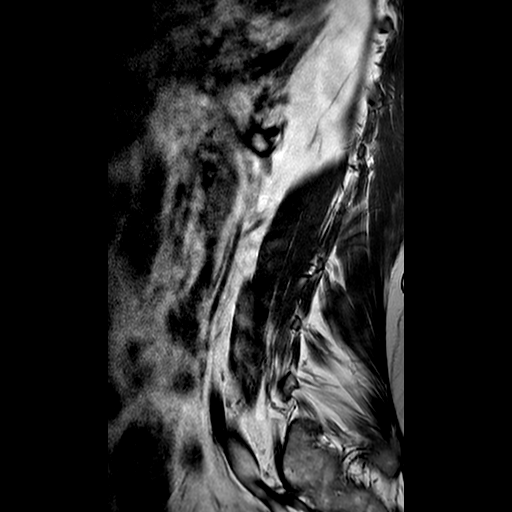
[im 5/17]
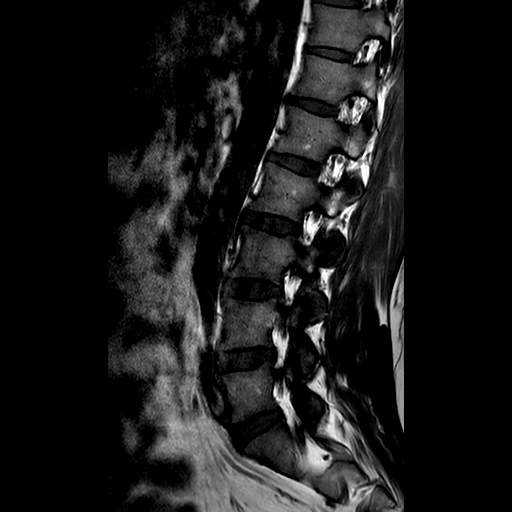
[im 9/17]
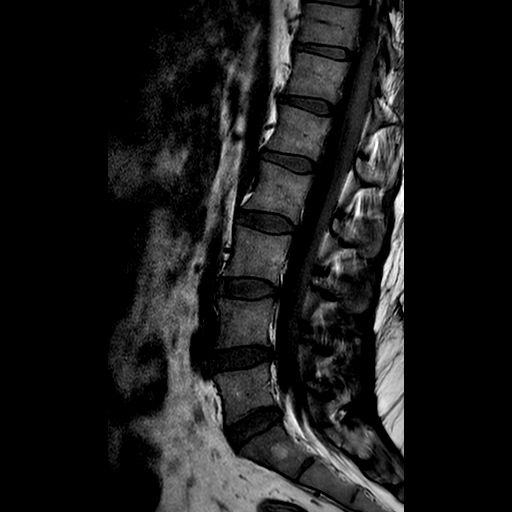
[im 13/17]
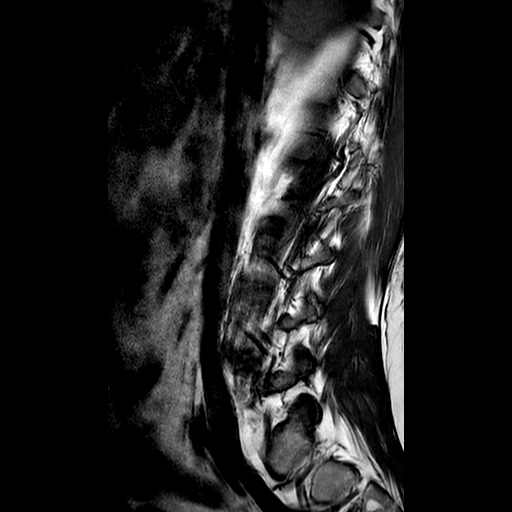
[im 17/17]
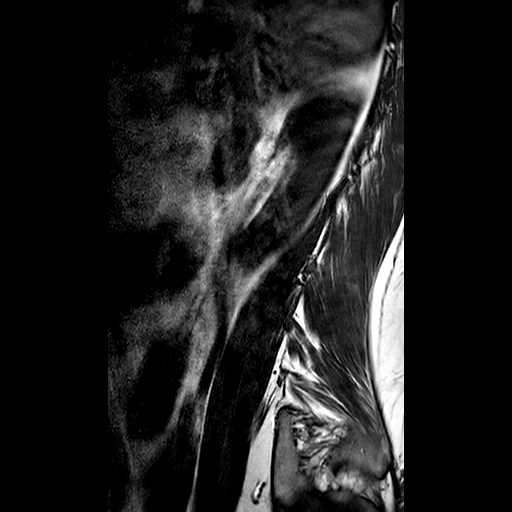

[13 of 48 positions shown; findings below may reference images not displayed]

FINDINGS: VERTEBRA: The vertebral bodies are normal in height and alignment. No acute 
vertebral body fracture. No spondylolisthesis. No marrow replacing lesions.  
SPINAL CORD: Mild flattening of the ventral cord at the level of the T3-4, T5-6 
and T7-8 disc protrusions. Spinal cord is otherwise normal in caliber and signal 
intensity, without abnormal enhancement. Conus medullaris is at the level of L1. 
THORACIC DISCS: Multilevel degenerative disc disease. Central T3-4 disc 
protrusion measures 2 mm in AP dimensions and mildly flattens the ventral cord. 
Tiny T5-6 right paracentral and T7-8 central disc protrusions flatten the 
ventral cord. 
Modic I-II: None. 
Ligamentum Flavum > 2.5 mm: All levels. 
T12-L1: The disc is normal in height and signal. No disc herniation. Normal 
facets. No spinal canal or neural foraminal stenosis. 
L1-L2: The disc is normal in height and signal. No disc herniation. Normal 
facets. No spinal canal or neural foraminal stenosis. 
L2-L3: The disc is normal in height and signal. No disc herniation. Normal 
facets. No spinal canal or neural foraminal stenosis. 
L3-L4: The disc is normal in height and signal. No disc herniation. Mild facet 
arthropathy. No spinal canal or neural foraminal stenosis. 
L4-L5: The disc is normal in height and signal. No disc herniation. Mild facet 
arthropathy. No spinal canal or neural foraminal stenosis. 
L5-S1: The disc is normal in height and signal. No disc herniation. Mild facet 
arthropathy. No spinal canal or neural foraminal stenosis. 
OTHER: The aorta is normal in diameter. 0.6 cm left mid renal cyst. The 
posterior paraspinal soft tissues are negative.
IMPRESSION: 1.  Mild multilevel degenerative change and mild flattening of the ventral cord 
at the level of the T3-4, T5-6 and T7-8 disc protrusions. 
2.  Spinal cord is otherwise normal in caliber and signal intensity, without 
abnormal enhancement.

## 2021-09-09 IMAGING — MR MRI THORACIC SPINE W/WO CONTRAST
5 of 12 series · 15 of 48 positions shown · IV contrast (gadolinium)
Comparison: None.

________________________________________________________________________________________________ 
MRI THORACIC SPINE W/WO CONTRAST, MRI LUMBAR SPINE W/WO CONTRAST, 09/09/2021 
[DATE]: 
CLINICAL INDICATION: Demyelinating disorder. Transient left hemibody weakness 
and sensory loss. Neural sensory loss. Fecal incontinence.
TECHNIQUE: Multiplanar, multiecho position MR images of the thoracic spine were 
performed without and with intravenous gadolinium enhancement. 10 mL of Gadavist 
were injected intravenously by hand. Patient was scanned on a 3T magnet.

[Series 801: t2w_cor-surv · coronal · 7.0mm · 0.50mm/px · 1 of 10 slices shown (1 of 2)]
[im 1/10]
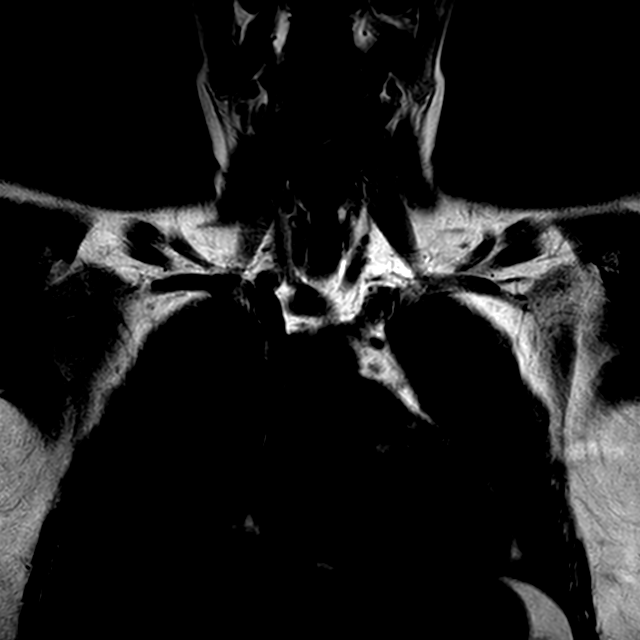

[Series 903: T1 · sagittal · 5.5mm · 0.66mm/px · 3 of 15 slices shown]
[im 1/15]
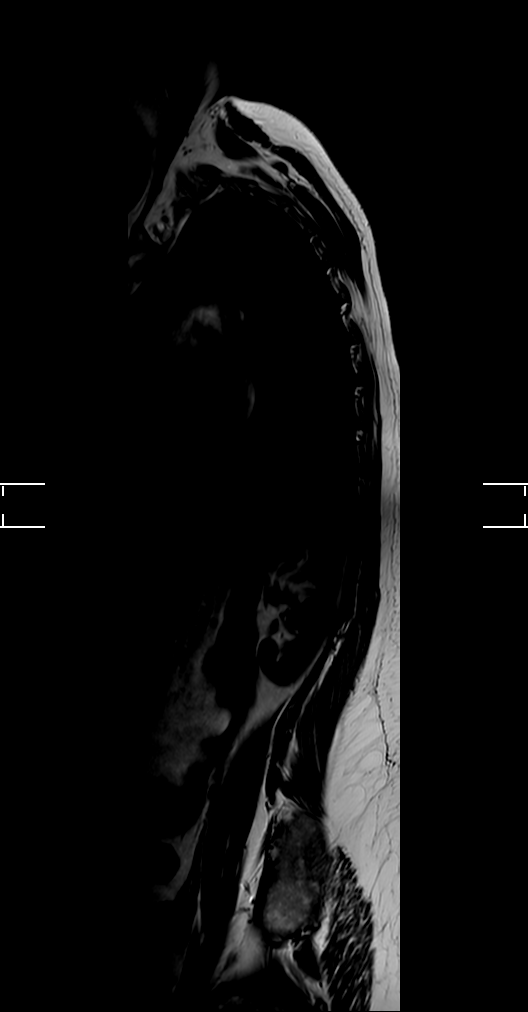
[im 8/15]
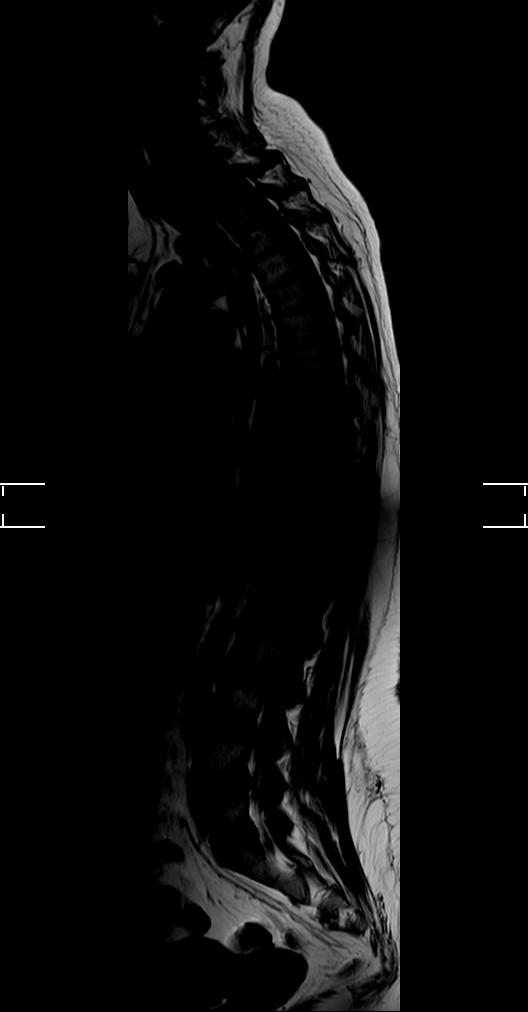
[im 15/15]
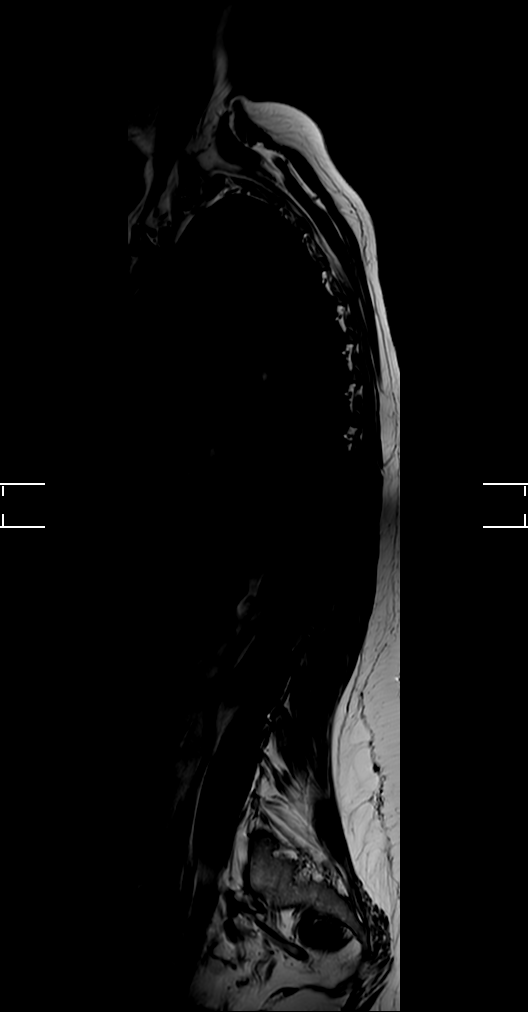

[Series 1001: t2w_cor-surv · coronal · 7.0mm · 0.50mm/px · 1 of 10 slices shown (2 of 2)]
[im 1/10]
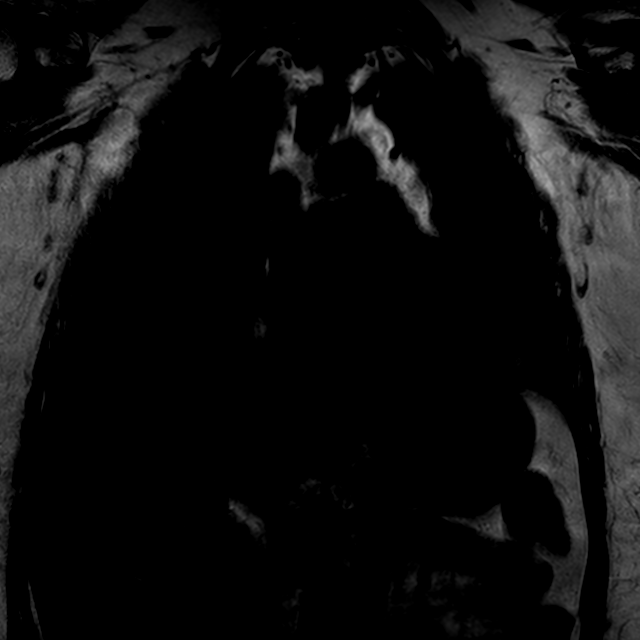

[Series 1601: T1 post-contrast · sagittal · 3.0mm · 0.56mm/px · 4 of 21 slices shown (1 of 2)]
[im 1/21]
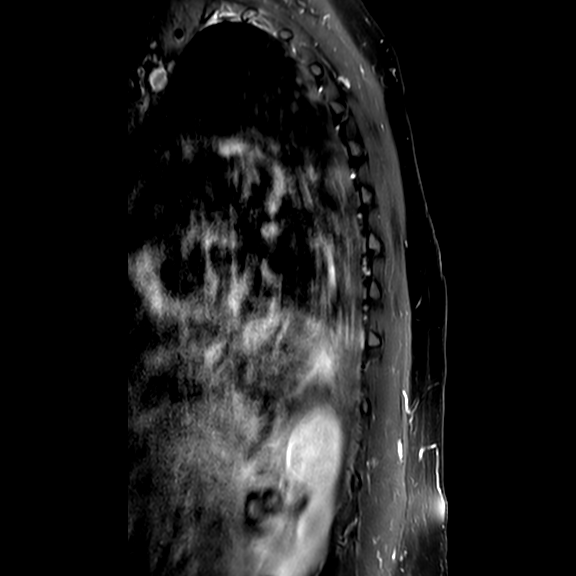
[im 7/21]
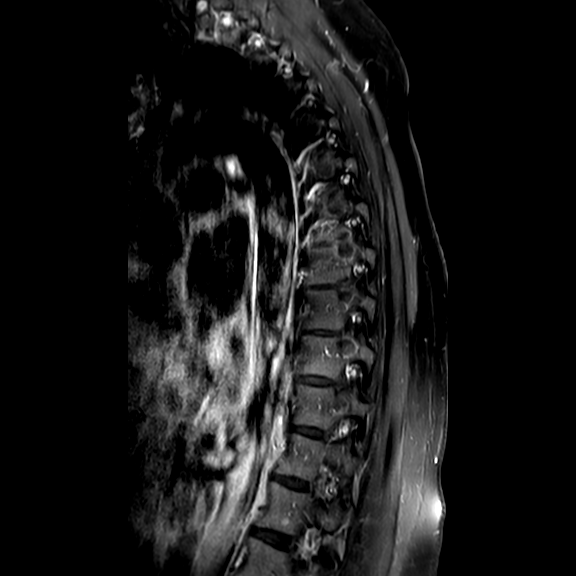
[im 14/21]
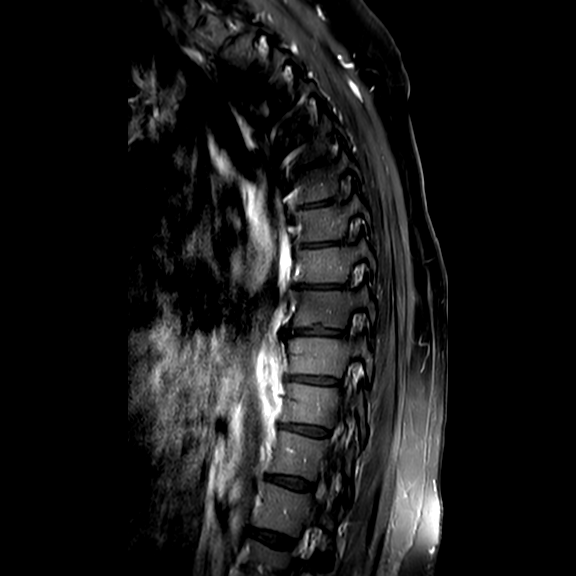
[im 21/21]
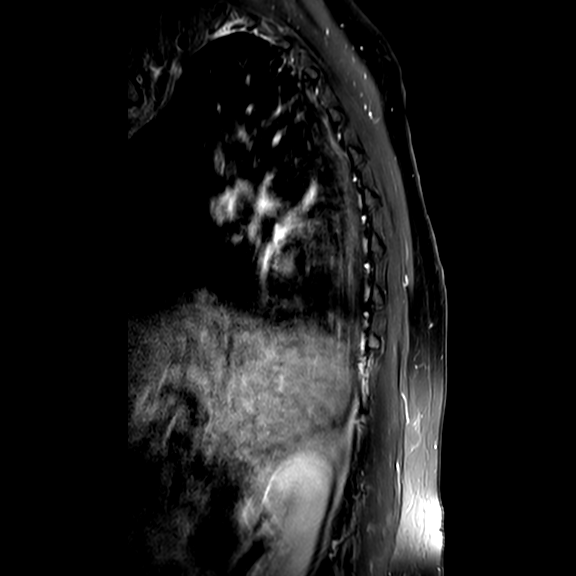

[Series 1702: T1 post-contrast · axial · 4.0mm · 0.40mm/px · z∈[-275,-120]mm · 6 of 32 slices shown (2 of 2)]
[im 1/32]
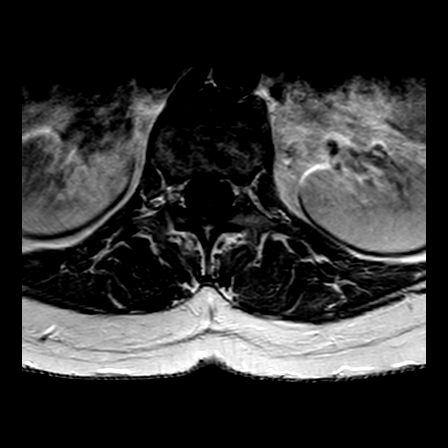
[im 7/32]
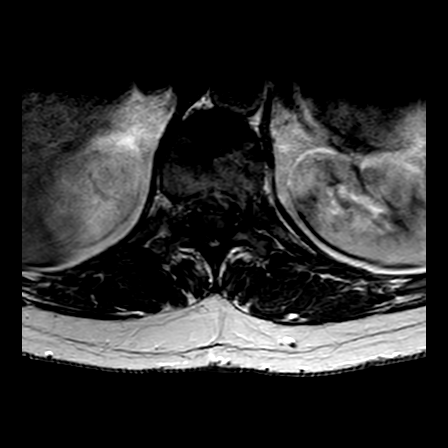
[im 13/32]
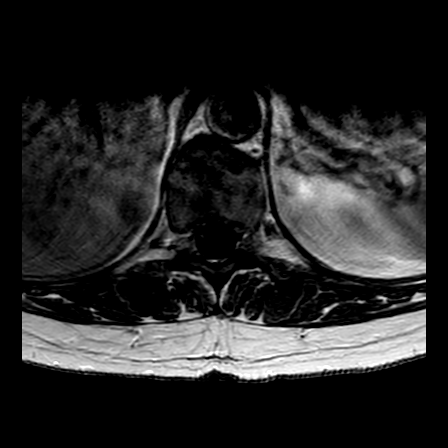
[im 19/32]
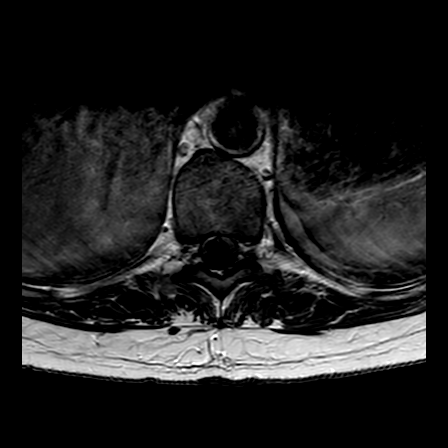
[im 25/32]
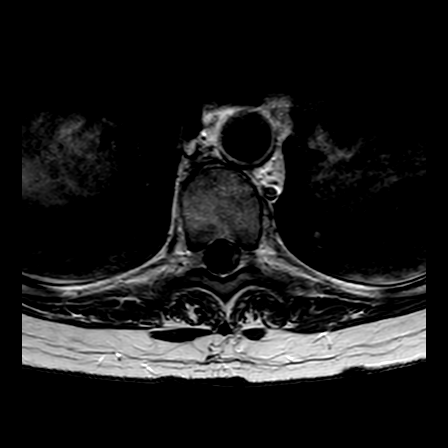
[im 32/32]
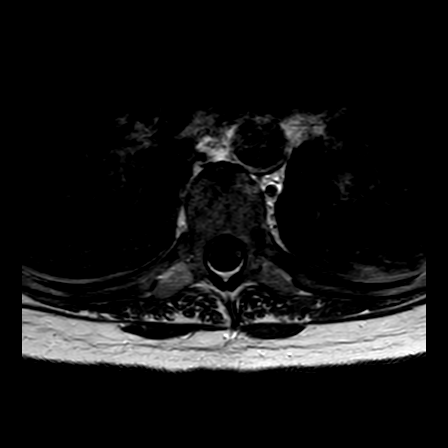

[15 of 48 positions shown; findings below may reference images not displayed]

FINDINGS: VERTEBRA: The vertebral bodies are normal in height and alignment. No acute 
vertebral body fracture. No spondylolisthesis. No marrow replacing lesions.  
SPINAL CORD: Mild flattening of the ventral cord at the level of the T3-4, T5-6 
and T7-8 disc protrusions. Spinal cord is otherwise normal in caliber and signal 
intensity, without abnormal enhancement. Conus medullaris is at the level of L1. 
THORACIC DISCS: Multilevel degenerative disc disease. Central T3-4 disc 
protrusion measures 2 mm in AP dimensions and mildly flattens the ventral cord. 
Tiny T5-6 right paracentral and T7-8 central disc protrusions flatten the 
ventral cord. 
Modic I-II: None. 
Ligamentum Flavum > 2.5 mm: All levels. 
T12-L1: The disc is normal in height and signal. No disc herniation. Normal 
facets. No spinal canal or neural foraminal stenosis. 
L1-L2: The disc is normal in height and signal. No disc herniation. Normal 
facets. No spinal canal or neural foraminal stenosis. 
L2-L3: The disc is normal in height and signal. No disc herniation. Normal 
facets. No spinal canal or neural foraminal stenosis. 
L3-L4: The disc is normal in height and signal. No disc herniation. Mild facet 
arthropathy. No spinal canal or neural foraminal stenosis. 
L4-L5: The disc is normal in height and signal. No disc herniation. Mild facet 
arthropathy. No spinal canal or neural foraminal stenosis. 
L5-S1: The disc is normal in height and signal. No disc herniation. Mild facet 
arthropathy. No spinal canal or neural foraminal stenosis. 
OTHER: The aorta is normal in diameter. 0.6 cm left mid renal cyst. The 
posterior paraspinal soft tissues are negative.
IMPRESSION: 1.  Mild multilevel degenerative change and mild flattening of the ventral cord 
at the level of the T3-4, T5-6 and T7-8 disc protrusions. 
2.  Spinal cord is otherwise normal in caliber and signal intensity, without 
abnormal enhancement.

## 2022-06-24 IMAGING — CT CT CHEST WITH CONTRAST
2 of 4 series · 14 of 36 positions shown, 17 images · IV contrast (ISOVUE 300)
Comparison: Pulmonary nodules.

________________________________________________________________________________________________ 
******** ADDENDUM #1 ********/n 
Compared to previous exam 04/23/2022. 
CT CHEST WITH CONTRAST, 06/24/2022 [DATE]: 
CLINICAL INDICATION: Solitary Pulmonary Nodule 
A search for DICOM formatted images was conducted for prior CT imaging studies 
completed at a non-affiliated media free facility.
TECHNIQUE: The chest was scanned from base of neck through the lung bases with 
100 mL of  Isovue 300 injected intravenously on a high resolution low dose CT 
scanner. 0 mL of Isovue 300 were discarded. Routine MPR and MIP 3D renderings 
were performed with concurrent physician supervision.

[Series 4: chest 2.0 i31s 3 · axial · 0.82mm/px · z∈[-309,-33]mm · 11 of 154 slices shown, 14 images]
[im 8/154  mediastinal]
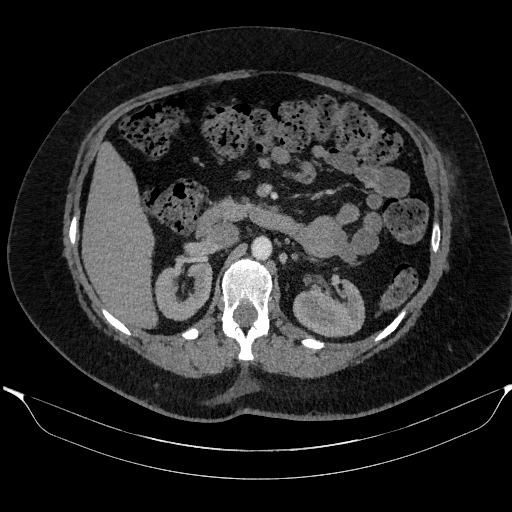
[im 8/154  lung]
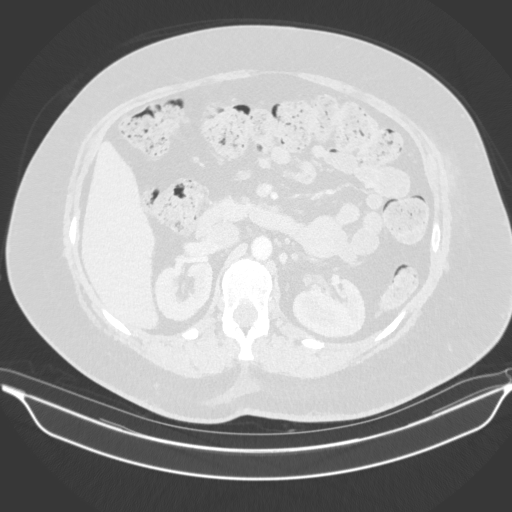
[im 22/154  lung]
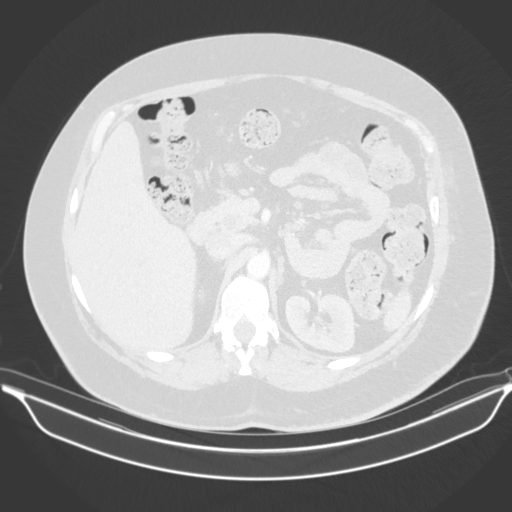
[im 37/154  lung]
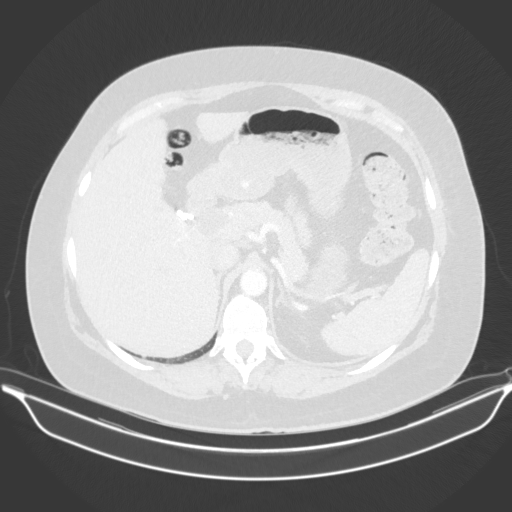
[im 52/154  lung]
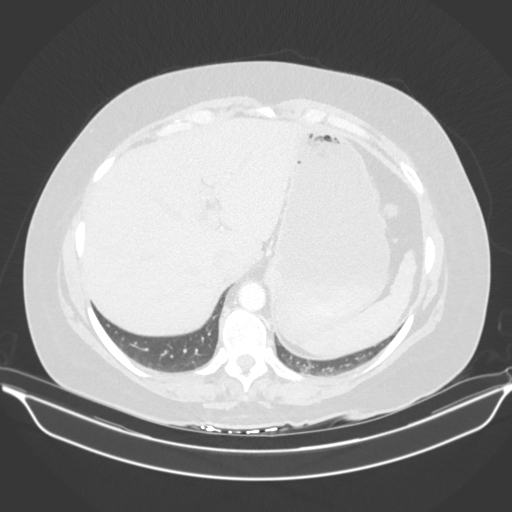
[im 66/154  mediastinal]
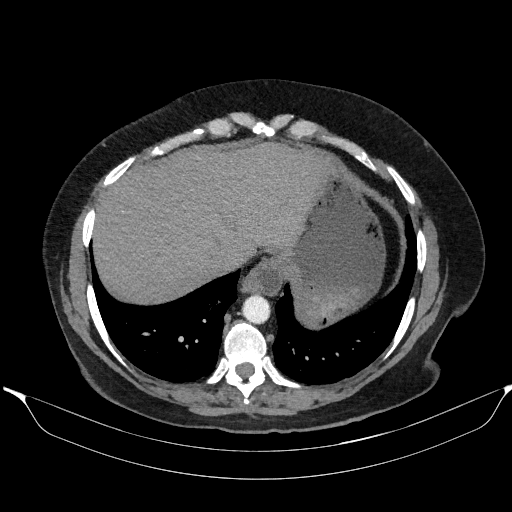
[im 66/154  lung]
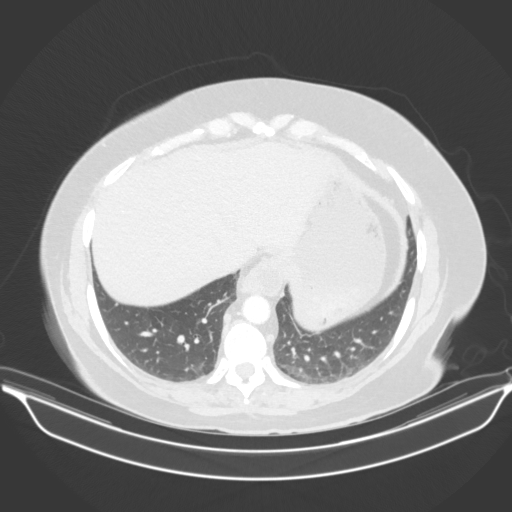
[im 81/154  lung]
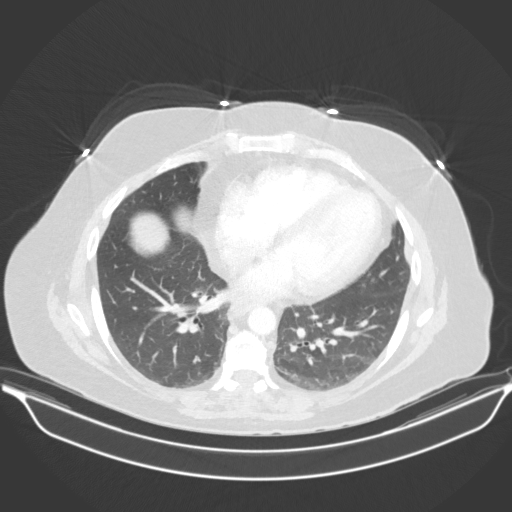
[im 88/154  lung]
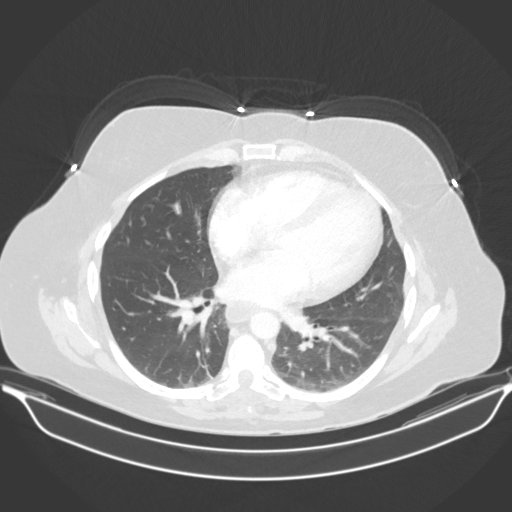
[im 103/154  lung]
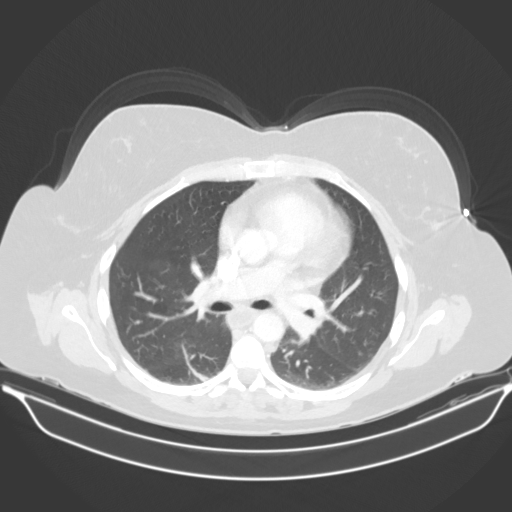
[im 117/154  mediastinal]
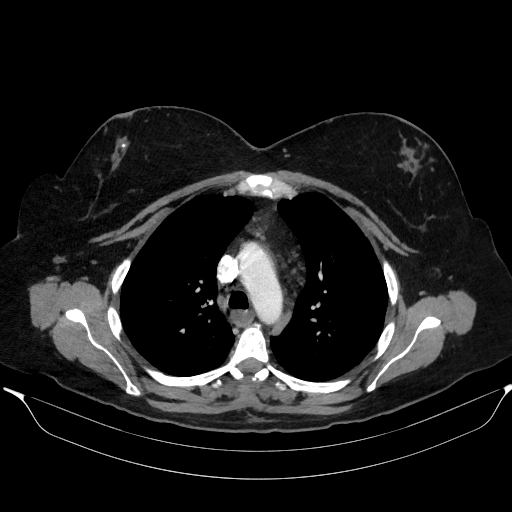
[im 117/154  lung]
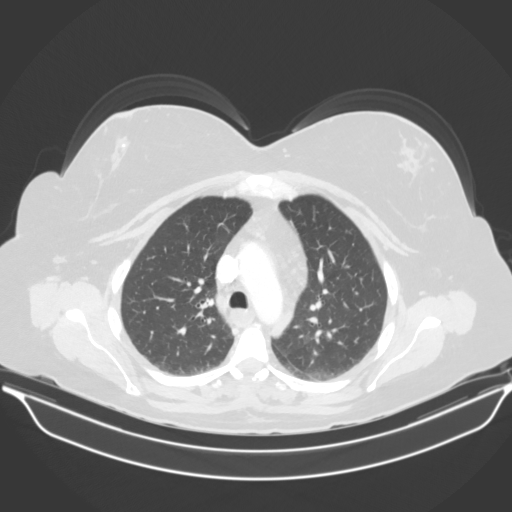
[im 132/154  lung]
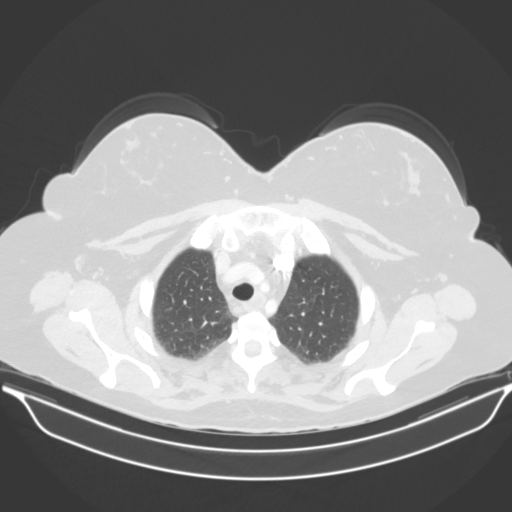
[im 146/154  lung]
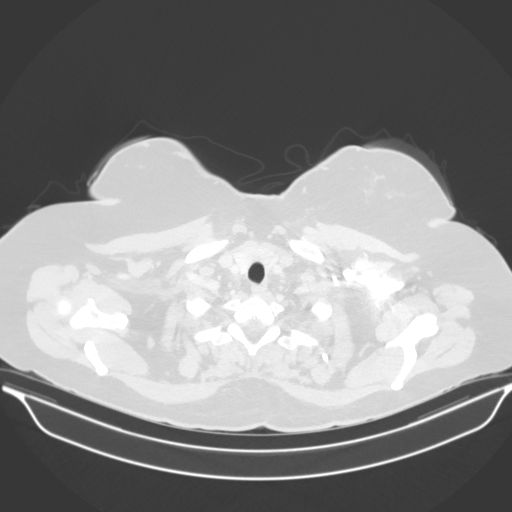

[Series 6: coronal · coronal · 0.65mm/px · 3 of 149 slices shown]
[im 30/149  lung]
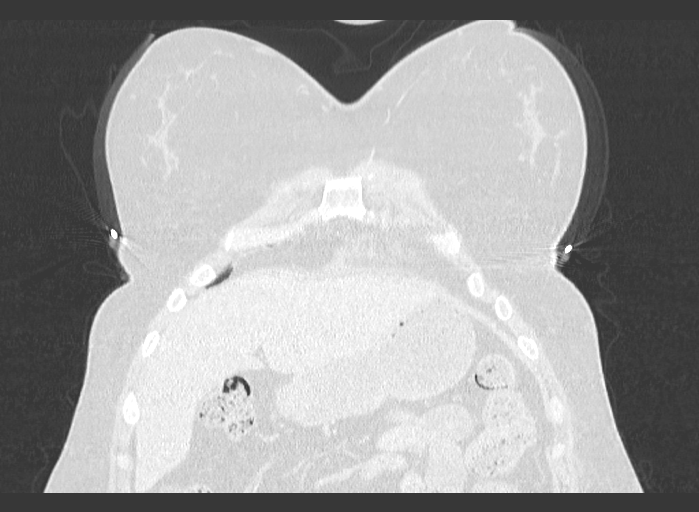
[im 60/149  lung]
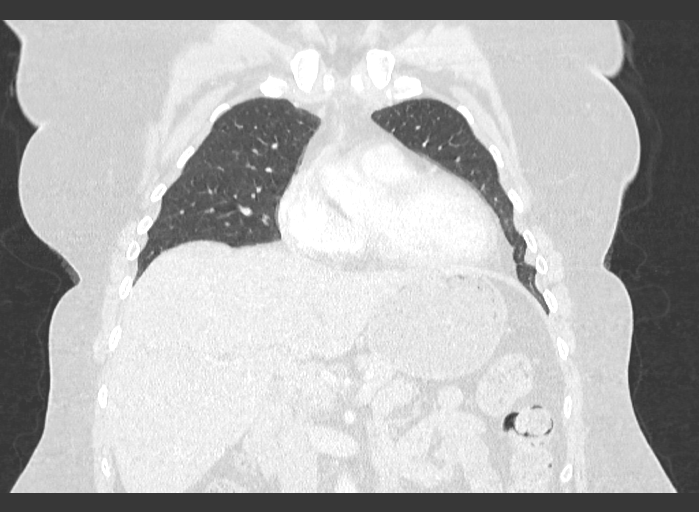
[im 89/149  lung]
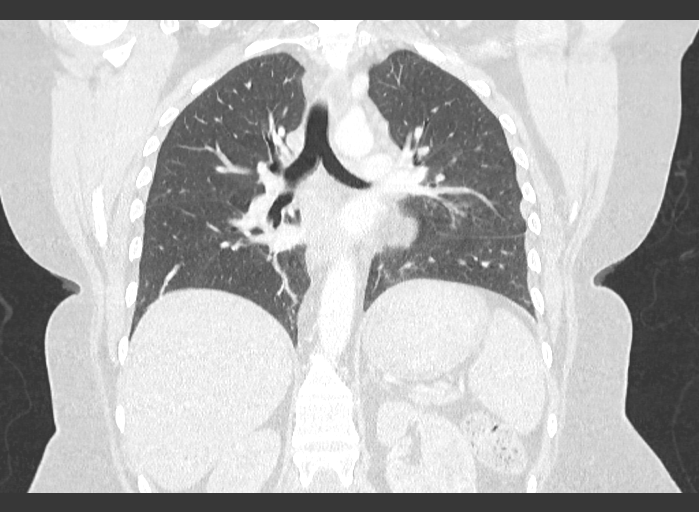

[14 of 36 positions shown; findings below may reference images not displayed]

FINDINGS: LUNGS AND PLEURA: Previously noted subsolid pulmonary nodules have resolved 
compared to the previous exam and most likely are inflammatory in nature. Mild 
peribronchial thickening. Improved aeration within both lungs. No new acute 
consolidation.. No pleural effusion.  
MEDIASTINUM:  Mild prominence anterior mediastinal lymph nodes and soft tissue 
density in the anterior mediastinum most likely representing residual thymus.. 
Normal heart size. No pericardial effusion. Mild distention fluid-filled 
esophagus. 
CHEST WALL/AXILLA: No mass or adenopathy.  
UPPER ABDOMEN: Status post cholecystectomy. Mild prominence common duct most 
likely related to the cholecystectomy. 
MUSCULOSKELETAL: No acute abnormality.
IMPRESSION: Reticular nodular infiltrates within the lungs have resolved compared to the 
previous exam is most suspicious of subsolid pulmonary nodules are most likely 
inflammatory in nature. Overall there is improved aeration within both lungs 
with no new acute consolidation or effusion. 
No new mediastinal or hilar adenopathy. 
Mild distention thoracic esophagus which appears fluid-filled. Recommend 
clinical correlation for reflux. 
RADIATION DOSE REDUCTION: All CT scans are performed using radiation dose 
reduction techniques, when applicable.  Technical factors are evaluated and 
adjusted to ensure appropriate moderation of exposure.  Automated dose 
management technology is applied to adjust the radiation doses to minimize 
exposure while achieving diagnostic quality images.
